# Patient Record
Sex: Male | Born: 1965
Health system: Southern US, Community
[De-identification: ages and names within clinical notes are randomized; demographics above are authoritative.]

## PROBLEM LIST (undated history)

## (undated) DIAGNOSIS — E119 Type 2 diabetes mellitus without complications: Secondary | ICD-10-CM

## (undated) DIAGNOSIS — E785 Hyperlipidemia, unspecified: Secondary | ICD-10-CM

## (undated) HISTORY — DX: Type 2 diabetes mellitus without complications: E11.9

## (undated) HISTORY — PX: ROTATOR CUFF REPAIR: SHX139

---

## 2011-08-16 ENCOUNTER — Emergency Department (INDEPENDENT_AMBULATORY_CARE_PROVIDER_SITE_OTHER)
Admission: EM | Admit: 2011-08-16 | Discharge: 2011-08-16 | Disposition: A | Discharge status: Home / Self Care | Source: Home / Self Care | Attending: Family Medicine | Admitting: Family Medicine

## 2011-08-16 DIAGNOSIS — J0100 Acute maxillary sinusitis, unspecified: Secondary | ICD-10-CM

## 2011-08-16 DIAGNOSIS — J029 Acute pharyngitis, unspecified: Secondary | ICD-10-CM

## 2011-08-16 HISTORY — DX: Hyperlipidemia, unspecified: E78.5

## 2011-08-16 LAB — POCT RAPID STREP A (OFFICE): Rapid Strep A Screen: NEGATIVE

## 2011-08-16 MED ORDER — BENZONATATE 200 MG PO CAPS: 200.0000 mg | ORAL_CAPSULE | Freq: Every day | ORAL | Status: AC

## 2011-08-16 MED ORDER — AZITHROMYCIN 250 MG PO TABS: ORAL_TABLET | ORAL | Status: AC

## 2011-08-16 NOTE — ED Notes (Signed)
Right ear, right eye discomfort and sore throat started last night

## 2011-08-16 NOTE — Discharge Instructions (Signed)
Take Mucinex D (guaifenesin with decongestant) twice daily for congestion.  Increase fluid intake, rest. May use Afrin nasal spray (or generic oxymetazoline) twice daily for about 5 days.  Also recommend using saline nasal spray several times daily and saline nasal irrigation (AYR is a common brand) Stop all antihistamines for now, and other non-prescription cough/cold preparations.     

## 2011-08-16 NOTE — ED Provider Notes (Signed)
History     CSN: [id number]  Arrival date & time 08/16/11  1191   First MD Initiated Contact with Patient 08/16/11 1922      Chief Complaint  Patient presents with  . Cough      HPI Comments: Patient complains of approximately two week history of persistent sinus congestion and watery eyes which he attributed to his seasonal allergies.  About four days ago he developed gradually progressive URI symptoms beginning with a mild sore throat, now worse on the right side.  A cough started next.  Complains of fatigue but no myalgias.  Cough is now worse at night and generally non-productive during the day.  There has been no pleuritic pain, shortness of breath, or wheezes.  He believes that he has had a low grade fever.  The history is provided by the patient.    Past Medical History  Diagnosis Date  . Hyperlipidemia     Past Surgical History  Procedure Date  . Rotator cuff repair     Family History  Problem Relation Age of Onset  . Cancer Mother   . Heart disease Mother   . Cancer Father   . Heart disease Father     History  Substance Use Topics  . Smoking status: Never Smoker   . Smokeless tobacco: Not on file  . Alcohol Use: No      Review of Systems + sore throat on right + cough No pleuritic pain No wheezing + nasal congestion + post-nasal drainage ? sinus pain/pressure No itchy/red eyes No  earache No hemoptysis No SOB ? Low grade fever, ? chills No nausea No vomiting No abdominal pain No diarrhea No urinary symptoms No skin rashes + fatigue No myalgias No headache Used OTC meds without relief (Claritin) Allergies  Review of patient's allergies indicates no known allergies.  Home Medications   Current Outpatient Rx  Name Route Sig Dispense Refill  . ASPIRIN 81 MG PO TABS Oral Take 81 mg by mouth daily.    Marland Kitchen EZETIMIBE-SIMVASTATIN 10-10 MG PO TABS Oral Take 1 tablet by mouth at bedtime.    Marland Kitchen ONE-DAILY MULTI VITAMINS PO TABS Oral Take 1 tablet  by mouth daily.    . OMEGA-3-ACID ETHYL ESTERS 1 G PO CAPS Oral Take 2 g by mouth 2 (two) times daily.    . AZITHROMYCIN 250 MG PO TABS  Take 2 tabs today; then begin one tab once daily for 4 more days. 6 each 0  . BENZONATATE 200 MG PO CAPS Oral Take 1 capsule (200 mg total) by mouth at bedtime. Take as needed for cough 12 capsule 0    BP 125/78  Pulse 70  Temp(Src) 97.9 F (36.6 C) (Oral)  Resp 18  Ht 5\' 6"  (1.676 m)  Wt 173 lb 8 oz (78.699 kg)  BMI 28.00 kg/m2  SpO2 95%  Physical Exam Nursing notes and Vital Signs reviewed. Appearance:  Patient appears healthy, stated age, and in no acute distress Eyes:  Pupils are equal, round, and reactive to light and accomodation.  Extraocular movement is intact.  Conjunctivae are not inflamed  Ears:  Canals normal.  Tympanic membranes normal.  Nose:  Congested turbinates.  No sinus tenderness.   Pharynx:  Erythema at base of uvula Neck:  Supple.   Tender shotty right anterior/posterior nodes   Lungs:  Clear to auscultation.  Breath sounds are equal.  Heart:  Regular rate and rhythm without murmurs, rubs, or gallops.  Abdomen:  Nontender without  masses or hepatosplenomegaly.  Bowel sounds are present.  No CVA or flank tenderness.  Extremities:  No edema.  No calf tenderness Skin:  No rash present.   ED Course  Procedures  none   Labs Reviewed  POCT RAPID STREP A (OFFICE) negative      1. Acute pharyngitis   2. Acute maxillary sinusitis   Suspect an early viral URI    MDM   Begin a Z-pack.  Prescription written for Benzonatate [facility]) to take at bedtime for night-time cough.  Take Mucinex D (guaifenesin with decongestant) twice daily for congestion.  Increase fluid intake, rest. May use Afrin nasal spray (or generic oxymetazoline) twice daily for about 5 days.  Also recommend using saline nasal spray several times daily and saline nasal irrigation (AYR is a common brand) Stop all antihistamines for now, and other  non-prescription cough/cold preparations. Followup with Family Doctor if not improved in one week.         Lattie Haw, MD 08/16/11 [phone]

## 2011-08-21 ENCOUNTER — Emergency Department: Admit: 2011-08-21 | Discharge: 2011-08-21 | Disposition: A | Discharge status: Home / Self Care

## 2011-08-21 ENCOUNTER — Encounter: Payer: Self-pay | Admitting: *Deleted

## 2011-08-21 ENCOUNTER — Emergency Department (INDEPENDENT_AMBULATORY_CARE_PROVIDER_SITE_OTHER)
Admission: EM | Admit: 2011-08-21 | Discharge: 2011-08-21 | Disposition: A | Discharge status: Home / Self Care | Source: Home / Self Care | Attending: Family Medicine | Admitting: Family Medicine

## 2011-08-21 DIAGNOSIS — J209 Acute bronchitis, unspecified: Secondary | ICD-10-CM

## 2011-08-21 MED ORDER — CLARITHROMYCIN 500 MG PO TABS: 500.0000 mg | ORAL_TABLET | Freq: Two times a day (BID) | ORAL | Status: AC

## 2011-08-21 MED ORDER — PREDNISONE 10 MG PO TABS: ORAL_TABLET | ORAL | Status: DC

## 2011-08-21 MED ORDER — GUAIFENESIN-CODEINE 100-10 MG/5ML PO SYRP: 10.0000 mL | ORAL_SOLUTION | Freq: Every day | ORAL | Status: AC

## 2011-08-21 MED ORDER — GUAIFENESIN ER 600 MG PO TB12: 1200.0000 mg | ORAL_TABLET | Freq: Two times a day (BID) | ORAL | Status: AC

## 2011-08-21 NOTE — ED Notes (Signed)
Pt seen here 3 days ago for cough.  Pt still having cough getting worse.  Cough is dry pt states losing sleep, sore neck and chest from coughing.

## 2011-08-21 NOTE — ED Provider Notes (Addendum)
History     CSN: [id number]  Arrival date & time 08/21/11  1158   First MD Initiated Contact with Patient 08/21/11 1254      Chief Complaint  Patient presents with  . Cough      HPI Comments: Patient reports that he finished the Z-pack but complains of persistent non-productive cough, worse at night.  He often coughs until he gags, and has developed discomfort in his right anterior chest when coughing.  He has mild shortness of breath with activity.  No fever but has had sweats.  He states that his sinus congestion has essentially resolved. He states that he believes that his tetanus immunization is current (he is in the [employer]).  Patient is a 46 y.o. male presenting with cough. The history is provided by the patient.  Cough This is a recurrent problem. The current episode started more than 1 week ago. The problem occurs every few minutes. The problem has been gradually worsening. The cough is non-productive. There has been no fever. Associated symptoms include chest pain, sweats and shortness of breath. Pertinent negatives include no chills, no weight loss, no ear congestion, no ear pain, no headaches, no rhinorrhea, no sore throat, no myalgias, no wheezing and no eye redness. Treatments tried: antibiotic. The treatment provided no relief. He is not a smoker.    Past Medical History  Diagnosis Date  . Hyperlipidemia     Past Surgical History  Procedure Date  . Rotator cuff repair     Family History  Problem Relation Age of Onset  . Cancer Mother   . Heart disease Mother   . Cancer Father   . Heart disease Father     History  Substance Use Topics  . Smoking status: Never Smoker   . Smokeless tobacco: Not on file  . Alcohol Use: No      Review of Systems  Constitutional: Negative for chills and weight loss.  HENT: Negative for ear pain, sore throat and rhinorrhea.   Eyes: Negative for redness.  Respiratory: Positive for cough and shortness of breath. Negative for  wheezing.   Cardiovascular: Positive for chest pain.  Musculoskeletal: Negative for myalgias.  Neurological: Negative for headaches.   No sore throat No cough ? pleuritic pain; has discomfort in right anterior chest with cough No wheezing + nasal congestion + post-nasal drainage No sinus pain/pressure No itchy/red eyes No earache No hemoptysis + mild SOB with activity No fever/chills, + sweats No nausea No vomiting No abdominal pain No diarrhea No urinary symptoms No skin rashes + fatigue No myalgias No headache   Allergies  Review of patient's allergies indicates no known allergies.  Home Medications   Current Outpatient Rx  Name Route Sig Dispense Refill  . FENOFIBRATE 145 MG PO TABS Oral Take 145 mg by mouth daily. Specific dosage unknown    . NIACIN 50 MG PO TABS Oral Take 50 mg by mouth daily with breakfast. Dosage unknown    . ASPIRIN 81 MG PO TABS Oral Take 81 mg by mouth daily.    . AZITHROMYCIN 250 MG PO TABS  Take 2 tabs today; then begin one tab once daily for 4 more days. 6 each 0  . BENZONATATE 200 MG PO CAPS Oral Take 1 capsule (200 mg total) by mouth at bedtime. Take as needed for cough 12 capsule 0  . CLARITHROMYCIN 500 MG PO TABS Oral Take 1 tablet (500 mg total) by mouth 2 (two) times daily. 14 tablet 0  .  EZETIMIBE-SIMVASTATIN 10-10 MG PO TABS Oral Take 1 tablet by mouth at bedtime.    . GUAIFENESIN ER 600 MG PO TB12 Oral Take 2 tablets (1,200 mg total) by mouth 2 (two) times daily. Take with 8oz glass of water 28 tablet 0  . GUAIFENESIN-CODEINE 100-10 MG/5ML PO SYRP Oral Take 10 mLs by mouth at bedtime. for cough 120 mL 0  . ONE-DAILY MULTI VITAMINS PO TABS Oral Take 1 tablet by mouth daily.    . OMEGA-3-ACID ETHYL ESTERS 1 G PO CAPS Oral Take 2 g by mouth 2 (two) times daily.    Marland Kitchen PREDNISONE 10 MG PO TABS  Take 2 tabs by mouth today, then two tabs twice daily for two days, then one tab twice daily for 2 days, then 1 tab daily for two days.  Take PC 16  tablet 0    BP 130/84  Pulse 77  Temp(Src) 98.3 F (36.8 C) (Oral)  Resp 16  Ht 5\' 6"  (1.676 m)  Wt 167 lb 8 oz (75.978 kg)  BMI 27.04 kg/m2  SpO2 97%  Physical Exam Nursing notes and Vital Signs reviewed. Appearance:  Patient appears healthy, stated age, and in no acute distress.  He is Alert and oriented  Eyes:  Pupils are equal, round, and reactive to light and accomodation.  Extraocular movement is intact.  Conjunctivae are not inflamed  Ears:  Canals normal.  Tympanic membranes normal.  Nose:  Mildly congested turbinates.  No sinus tenderness.    Pharynx:  Normal Neck:  Supple.  Slightly tender shotty posterior nodes are palpated bilaterally  Lungs:  Clear to auscultation.  Breath sounds are equal.  Chest:  No tenderness to palpation Heart:  Regular rate and rhythm without murmurs, rubs, or gallops.  Abdomen:  Nontender without masses or hepatosplenomegaly.  Bowel sounds are present.  No CVA or flank tenderness.  Extremities:  No edema.  No calf tenderness Skin:  No rash present.   ED Course  Procedures    Dg Chest 2 View  08/21/2011  *RADIOLOGY REPORT*  Clinical Data: Persistent cough for 10 days  CHEST - 2 VIEW  Comparison: None  Findings: The heart size and mediastinal contours are within normal limits.  Both lungs are clear.  The visualized skeletal structures are unremarkable.  IMPRESSION: Negative exam.  Original Report Authenticated By: Rosealee Albee, M.D.     1. Acute bronchitis   Sinusitis appears to be resolved    MDM   ? Pertussis Begin Biaxin.  Robitussin AC at bedtime.  Tapering course of prednisone.  Increase fluid intake, rest.  Will ask patient to hold cholesterol medications for about 5 days to minimize possibility of hepatic injury with Biaxin. Stop all antihistamines for now, and other non-prescription cough/cold preparations. Followup with Family Doctor if not improved in one week.         Lattie Haw, MD 08/21/11 1403  Lattie Haw, MD 08/21/11 [phone]

## 2011-08-21 NOTE — Discharge Instructions (Signed)
Increase fluid intake, rest.  Hold cholesterol medications for about 5 days. Stop all antihistamines for now, and other non-prescription cough/cold preparations.

## 2011-08-23 ENCOUNTER — Telehealth: Payer: Self-pay | Admitting: Family Medicine

## 2011-08-26 ENCOUNTER — Telehealth: Payer: Self-pay | Admitting: Emergency Medicine

## 2011-11-20 ENCOUNTER — Emergency Department (INDEPENDENT_AMBULATORY_CARE_PROVIDER_SITE_OTHER)
Admission: EM | Admit: 2011-11-20 | Discharge: 2011-11-20 | Disposition: A | Discharge status: Home / Self Care | Source: Home / Self Care | Attending: Family Medicine | Admitting: Family Medicine

## 2011-11-20 ENCOUNTER — Telehealth: Payer: Self-pay | Admitting: Family Medicine

## 2011-11-20 DIAGNOSIS — H109 Unspecified conjunctivitis: Secondary | ICD-10-CM

## 2011-11-20 MED ORDER — POLYMYXIN B-TRIMETHOPRIM 10000-0.1 UNIT/ML-% OP SOLN: OPHTHALMIC | Status: DC

## 2011-11-20 MED ORDER — SULFACETAMIDE-PREDNISOLONE 10-0.2 % OP SUSP: 2.0000 [drp] | OPHTHALMIC | Status: DC

## 2011-11-20 MED ORDER — PREDNISOLONE ACETATE 1 % OP SUSP: 1.0000 [drp] | Freq: Four times a day (QID) | OPHTHALMIC | Status: AC

## 2011-11-20 NOTE — ED Notes (Signed)
Caleb Beck woke on Friday morning with a tender right eye. He did do some lawn work the day before. When he woke today his right eye was red and watery. He is unable to keep his right eye open for any length of time.

## 2011-11-20 NOTE — ED Provider Notes (Signed)
History     CSN: [id number]  Arrival date & time 11/20/11  1249   First MD Initiated Contact with Patient 11/20/11 1348      Chief Complaint  Patient presents with  . Eye Drainage    watery right eye     HPI Comments: Patient cut grass two days ago, and the next morning awoke with irritation and redness in his right eye.  He had been wearing his contacts while cutting grass.  Today his left eye has become red and irritated.  He has mild nasal congestion and has a history of mild seasonal rhinitis.  He tried some of his wife's Pataday eye drops without improvement.  No changes in vision.  Patient is a 46 y.o. male presenting with conjunctivitis. The history is provided by the patient.  Conjunctivitis  The current episode started 2 days ago. The onset was sudden. The problem occurs continuously. The problem has been gradually worsening. The problem is moderate. Nothing relieves the symptoms. The symptoms are aggravated by light. Associated symptoms include photophobia, congestion, mouth sores, rhinorrhea, eye discharge and eye redness. Pertinent negatives include no fever, no decreased vision, no double vision, no eye itching, no ear discharge, no ear pain, no headaches, no hearing loss, no sore throat, no swollen glands, no cough, no URI and no eye pain. The eye pain is mild. There is pain in both eyes. The eye pain is associated with movement.    Past Medical History  Diagnosis Date  . Hyperlipidemia     Past Surgical History  Procedure Date  . Rotator cuff repair     Family History  Problem Relation Age of Onset  . Cancer Mother   . Heart disease Mother   . Cancer Father   . Heart disease Father     History  Substance Use Topics  . Smoking status: Never Smoker   . Smokeless tobacco: Not on file  . Alcohol Use: No      Review of Systems  Constitutional: Negative for fever.  HENT: Positive for congestion, rhinorrhea and mouth sores. Negative for hearing loss, ear pain,  sore throat and ear discharge.   Eyes: Positive for photophobia, discharge and redness. Negative for double vision, pain and itching.  Respiratory: Negative for cough.   Neurological: Negative for headaches.  All other systems reviewed and are negative.    Allergies  Review of patient's allergies indicates no known allergies.  Home Medications   Current Outpatient Rx  Name Route Sig Dispense Refill  . ASPIRIN 81 MG PO TABS Oral Take 81 mg by mouth daily.    Marland Kitchen EZETIMIBE-SIMVASTATIN 10-10 MG PO TABS Oral Take 1 tablet by mouth at bedtime.    . FENOFIBRATE 145 MG PO TABS Oral Take 145 mg by mouth daily. Specific dosage unknown    . GUAIFENESIN ER 600 MG PO TB12 Oral Take 2 tablets (1,200 mg total) by mouth 2 (two) times daily. Take with 8oz glass of water 28 tablet 0  . ONE-DAILY MULTI VITAMINS PO TABS Oral Take 1 tablet by mouth daily.    Marland Kitchen NIACIN 50 MG PO TABS Oral Take 50 mg by mouth daily with breakfast. Dosage unknown    . OMEGA-3-ACID ETHYL ESTERS 1 G PO CAPS Oral Take 2 g by mouth 2 (two) times daily.    Marland Kitchen PREDNISONE 10 MG PO TABS  Take 2 tabs by mouth today, then two tabs twice daily for two days, then one tab twice daily for 2 days,  then 1 tab daily for two days.  Take PC 16 tablet 0  . SULFACETAMIDE-PREDNISOLONE 10-0.2 % OP SUSP Both Eyes Place 2 drops into both eyes every 4 (four) hours while awake. Use for 4 days then stop. 10 mL 0    BP 126/85  Pulse 70  Temp 98 F (36.7 C) (Oral)  Resp 17  Ht 5\' 6"  (1.676 m)  Wt 165 lb (74.844 kg)  BMI 26.63 kg/m2  SpO2 97%  Physical Exam  Constitutional: He appears well-developed and well-nourished. No distress.  HENT:  Head: Atraumatic.  Right Ear: Tympanic membrane normal.  Left Ear: Tympanic membrane normal.  Nose: Nose normal.  Mouth/Throat: Oropharynx is clear and moist. No oropharyngeal exudate.  Eyes: EOM and lids are normal. Pupils are equal, round, and reactive to light. No foreign bodies found. Right eye exhibits no  chemosis, no discharge, no exudate and no hordeolum. No foreign body present in the right eye. Left eye exhibits no chemosis, no discharge, no exudate and no hordeolum. No foreign body present in the left eye. Right conjunctiva is injected. Right conjunctiva has no hemorrhage. Left conjunctiva is injected. Left conjunctiva has no hemorrhage. No scleral icterus.       Right eye reveals no fluorescein uptake.   Neck: Neck supple.  Cardiovascular: Normal rate and normal heart sounds.   Pulmonary/Chest: Breath sounds normal.  Lymphadenopathy:    He has no cervical adenopathy.    ED Course  Procedures  none      1. Conjunctivitis of both eyes       MDM  Begin Polytrim ophth susp, 1 gtt QID, and prednisolone ophth susp, 1gtt QID Followup with ophthalmologist if not improved 4 days.        Lattie Haw, MD 11/20/11 [phone]

## 2011-11-22 ENCOUNTER — Telehealth: Payer: Self-pay

## 2011-11-22 NOTE — ED Notes (Signed)
I called and spoke with patient and he is doing better. I advised to call back if anything changes or if he has questions or concerns.  

## 2012-05-29 ENCOUNTER — Encounter: Payer: Self-pay | Admitting: Emergency Medicine

## 2012-05-29 ENCOUNTER — Emergency Department (INDEPENDENT_AMBULATORY_CARE_PROVIDER_SITE_OTHER)
Admission: EM | Admit: 2012-05-29 | Discharge: 2012-05-29 | Disposition: A | Discharge status: Home / Self Care | Source: Home / Self Care | Attending: Family Medicine | Admitting: Family Medicine

## 2012-05-29 DIAGNOSIS — J209 Acute bronchitis, unspecified: Secondary | ICD-10-CM

## 2012-05-29 MED ORDER — BENZONATATE 200 MG PO CAPS: 200.0000 mg | ORAL_CAPSULE | Freq: Every day | ORAL | Status: DC

## 2012-05-29 MED ORDER — CLARITHROMYCIN 500 MG PO TABS: 500.0000 mg | ORAL_TABLET | Freq: Two times a day (BID) | ORAL | Status: DC

## 2012-05-29 NOTE — ED Provider Notes (Signed)
History     CSN: [id number]  Arrival date & time 05/29/12  1625   First MD Initiated Contact with Patient 05/29/12 1655      Chief Complaint  Patient presents with  . Cough      HPI Comments: One week ago patient developed "tickle" in his throat, followed by nasal congestion, mild myalgias, and a non-productive cough.  The cough has persisted and is worse at night.  No fevers, chills, and sweats.  He has had flu immunization this season.  The history is provided by the patient.    Past Medical History  Diagnosis Date  . Hyperlipidemia     Past Surgical History  Procedure Date  . Rotator cuff repair     Family History  Problem Relation Age of Onset  . Cancer Mother   . Heart disease Mother   . Cancer Father   . Heart disease Father     History  Substance Use Topics  . Smoking status: Never Smoker   . Smokeless tobacco: Not on file  . Alcohol Use: No      Review of Systems No sore throat + cough No pleuritic pain No wheezing + minimal nasal congestion ? post-nasal drainage No sinus pain/pressure No itchy/red eyes No earache No hemoptysis No SOB No fever/chills No nausea No vomiting No abdominal pain No diarrhea No urinary symptoms No skin rashes + fatigue + mild myalgias No headache Used OTC meds without relief  Allergies  Review of patient's allergies indicates no known allergies.  Home Medications   Current Outpatient Rx  Name  Route  Sig  Dispense  Refill  . ASPIRIN 81 MG PO TABS   Oral   Take 81 mg by mouth daily.         Marland Kitchen BENZONATATE 200 MG PO CAPS   Oral   Take 1 capsule (200 mg total) by mouth at bedtime. Take as needed for cough   12 capsule   0   . CLARITHROMYCIN 500 MG PO TABS   Oral   Take 1 tablet (500 mg total) by mouth 2 (two) times daily.   14 tablet   0   . EZETIMIBE-SIMVASTATIN 10-10 MG PO TABS   Oral   Take 1 tablet by mouth at bedtime.         . FENOFIBRATE 145 MG PO TABS   Oral   Take 145 mg by  mouth daily. Specific dosage unknown         . GUAIFENESIN ER 600 MG PO TB12   Oral   Take 2 tablets (1,200 mg total) by mouth 2 (two) times daily. Take with 8oz glass of water   28 tablet   0   . ONE-DAILY MULTI VITAMINS PO TABS   Oral   Take 1 tablet by mouth daily.         Marland Kitchen NIACIN 50 MG PO TABS   Oral   Take 50 mg by mouth daily with breakfast. Dosage unknown         . OMEGA-3-ACID ETHYL ESTERS 1 G PO CAPS   Oral   Take 2 g by mouth 2 (two) times daily.         Marland Kitchen PREDNISONE 10 MG PO TABS      Take 2 tabs by mouth today, then two tabs twice daily for two days, then one tab twice daily for 2 days, then 1 tab daily for two days.  Take PC   16 tablet  0   . POLYMYXIN B-TRIMETHOPRIM 10000-0.1 UNIT/ML-% OP SOLN      Place one drop in both eyes four times daily   10 mL   0     BP 128/89  Pulse 83  Temp 97.7 F (36.5 C) (Oral)  Resp 16  Ht 5\' 6"  (1.676 m)  Wt 178 lb (80.74 kg)  BMI 28.73 kg/m2  SpO2 93%  Physical Exam Nursing notes and Vital Signs reviewed. Appearance:  Patient appears healthy, stated age, and in no acute distress Eyes:  Pupils are equal, round, and reactive to light and accomodation.  Extraocular movement is intact.  Conjunctivae are not inflamed  Ears:  Canals normal.  Tympanic membranes normal.  Nose:  Mildly congested turbinates.  No sinus tenderness.   Pharynx:  Normal Neck:  Supple.   No adenopathy Lungs:  Clear to auscultation.  Breath sounds are equal.  Heart:  Regular rate and rhythm without murmurs, rubs, or gallops.  Abdomen:  Nontender without masses or hepatosplenomegaly.  Bowel sounds are present.  No CVA or flank tenderness.  Extremities:  No edema.  No calf tenderness Skin:  No rash present.   ED Course  Procedures none      1. Acute bronchitis       MDM  Will begin Biaxin for atypical coverage.  Prescription written for Benzonatate [facility]) to take at bedtime for night-time cough.  Take plain Mucinex  (guaifenesin) twice daily for cough and congestion.  Increase fluid intake, rest. May use Afrin nasal spray (or generic oxymetazoline) twice daily for about 5 days.  Also recommend using saline nasal spray several times daily and saline nasal irrigation (AYR is a common brand) Stop all antihistamines for now, and other non-prescription cough/cold preparations. Stop taking Vytorin while taking Biaxin. Follow-up with family doctor if not improving 7 to 10 days.         Lattie Haw, MD 05/29/12 1755

## 2012-05-29 NOTE — ED Notes (Signed)
Dry cough x 1 week 

## 2012-06-15 ENCOUNTER — Encounter: Payer: Self-pay | Admitting: Emergency Medicine

## 2012-06-15 ENCOUNTER — Emergency Department (INDEPENDENT_AMBULATORY_CARE_PROVIDER_SITE_OTHER)
Admission: EM | Admit: 2012-06-15 | Discharge: 2012-06-15 | Disposition: A | Discharge status: Home / Self Care | Source: Home / Self Care | Attending: Family Medicine | Admitting: Family Medicine

## 2012-06-15 DIAGNOSIS — R05 Cough: Secondary | ICD-10-CM

## 2012-06-15 DIAGNOSIS — J309 Allergic rhinitis, unspecified: Secondary | ICD-10-CM

## 2012-06-15 MED ORDER — CETIRIZINE HCL 10 MG PO CAPS: 10.0000 mg | ORAL_CAPSULE | Freq: Every day | ORAL | Status: DC

## 2012-06-15 MED ORDER — DOXYCYCLINE HYCLATE 100 MG PO CAPS: 100.0000 mg | ORAL_CAPSULE | Freq: Two times a day (BID) | ORAL | Status: AC

## 2012-06-15 MED ORDER — FLUTICASONE PROPIONATE 50 MCG/ACT NA SUSP: 2.0000 | Freq: Every day | NASAL | Status: DC

## 2012-06-15 MED ORDER — METHYLPREDNISOLONE ACETATE 80 MG/ML IJ SUSP
80.0000 mg | Freq: Once | INTRAMUSCULAR | Status: AC
  Administered 2012-06-15: 80 mg via INTRAMUSCULAR

## 2012-06-15 MED ORDER — HYDROCOD POLST-CHLORPHEN POLST 10-8 MG/5ML PO LQCR: 5.0000 mL | Freq: Two times a day (BID) | ORAL | Status: DC | PRN

## 2012-06-15 NOTE — ED Provider Notes (Signed)
History     CSN: [id number]  Arrival date & time 06/15/12  1634   First MD Initiated Contact with Patient 06/15/12 1635      Chief Complaint  Patient presents with  . Cough   HPI Patient presents today with chief complaint of cough. Patient was recently seen back on January 13 with similar symptoms in the setting of questionable your eye. Patient was placed on course of Biaxin as well as Lawyer for symptomatic management. Patient states the majority symptoms have improved significantly since his last visit. However, patient states that nasal drainage as well as cough is been quite persistent. Patient states the cough is fairly dry in nature. No shortness of breath. Cough seems to be most predominant at night. No fevers. Patient does report some mild allergic symptoms including rhinorrhea sneezing and developing over the past 3-4 years. Patient has had some mild sneezing with symptoms. Patient denies any wheezing associated with symptoms. Does have a minimal amount of nasal congestion. Patient is a nonsmoker.   Past Medical History  Diagnosis Date  . Hyperlipidemia     Past Surgical History  Procedure Date  . Rotator cuff repair     Family History  Problem Relation Age of Onset  . Cancer Mother   . Heart disease Mother   . Cancer Father   . Heart disease Father     History  Substance Use Topics  . Smoking status: Never Smoker   . Smokeless tobacco: Not on file  . Alcohol Use: No      Review of Systems  All other systems reviewed and are negative.    Allergies  Review of patient's allergies indicates not on file.  Home Medications   Current Outpatient Rx  Name  Route  Sig  Dispense  Refill  . ASPIRIN 81 MG PO TABS   Oral   Take 81 mg by mouth daily.         Marland Kitchen BENZONATATE 200 MG PO CAPS   Oral   Take 1 capsule (200 mg total) by mouth at bedtime. Take as needed for cough   12 capsule   0   . CLARITHROMYCIN 500 MG PO TABS   Oral   Take 1  tablet (500 mg total) by mouth 2 (two) times daily.   14 tablet   0   . EZETIMIBE-SIMVASTATIN 10-10 MG PO TABS   Oral   Take 1 tablet by mouth at bedtime.         . FENOFIBRATE 145 MG PO TABS   Oral   Take 145 mg by mouth daily. Specific dosage unknown         . GUAIFENESIN ER 600 MG PO TB12   Oral   Take 2 tablets (1,200 mg total) by mouth 2 (two) times daily. Take with 8oz glass of water   28 tablet   0   . ONE-DAILY MULTI VITAMINS PO TABS   Oral   Take 1 tablet by mouth daily.         Marland Kitchen NIACIN 50 MG PO TABS   Oral   Take 50 mg by mouth daily with breakfast. Dosage unknown         . OMEGA-3-ACID ETHYL ESTERS 1 G PO CAPS   Oral   Take 2 g by mouth 2 (two) times daily.         Marland Kitchen PREDNISONE 10 MG PO TABS      Take 2 tabs by mouth today, then two  tabs twice daily for two days, then one tab twice daily for 2 days, then 1 tab daily for two days.  Take PC   16 tablet   0   . POLYMYXIN B-TRIMETHOPRIM 10000-0.1 UNIT/ML-% OP SOLN      Place one drop in both eyes four times daily   10 mL   0     BP 111/73  Pulse 93  Temp 98.7 F (37.1 C) (Oral)  Resp 16  Ht 5\' 6"  (1.676 m)  Wt 173 lb (78.472 kg)  BMI 27.92 kg/m2  SpO2 96%  Physical Exam  Constitutional: He appears well-developed and well-nourished.  HENT:  Head: Normocephalic and atraumatic.  Right Ear: External ear normal.  Left Ear: External ear normal.       +nasal erythema, rhinorrhea bilaterally, + post oropharyngeal erythema    Eyes: Conjunctivae normal are normal. Pupils are equal, round, and reactive to light.  Neck: Normal range of motion. Neck supple.  Cardiovascular: Normal rate, regular rhythm and normal heart sounds.   Pulmonary/Chest: Effort normal and breath sounds normal.  Abdominal: Soft.  Musculoskeletal: Normal range of motion.  Lymphadenopathy:    He has no cervical adenopathy.  Neurological: He is alert.  Skin: Skin is warm.    ED Course  Procedures (including critical  care time)  Labs Reviewed - No data to display No results found.   1. Cough   2. Allergic rhinitis       MDM  I suspect his symptoms are predominantly upper respiratory and origin with underlying allergic rhinitis is currently not being treated as well as post viral cough. Will place patient on course of Flonase and Zyrtec to help with this. Patient given shot of Depo-Medrol 80 mg IM x1 to help with sinus inflammation. Will place on course of doxycycline for lower respiratory coverage. Discuss infectious and respiratory red flags at length with patient. Plan to follow PCP if not improved within next 5-7 days.    The patient and/or caregiver has been counseled thoroughly with regard to treatment plan and/or medications prescribed including dosage, schedule, interactions, rationale for use, and possible side effects and they verbalize understanding. Diagnoses and expected course of recovery discussed and will return if not improved as expected or if the condition worsens. Patient and/or caregiver verbalized understanding.               Doree Albee, MD 06/15/12 [phone]

## 2012-06-15 NOTE — ED Notes (Signed)
Dry cough x 3 weeks 

## 2012-11-02 ENCOUNTER — Other Ambulatory Visit: Payer: Self-pay | Admitting: Family Medicine

## 2013-07-24 ENCOUNTER — Emergency Department (INDEPENDENT_AMBULATORY_CARE_PROVIDER_SITE_OTHER)
Admission: EM | Admit: 2013-07-24 | Discharge: 2013-07-24 | Disposition: A | Discharge status: Home / Self Care | Source: Home / Self Care | Attending: Emergency Medicine | Admitting: Emergency Medicine

## 2013-07-24 ENCOUNTER — Encounter: Payer: Self-pay | Admitting: Emergency Medicine

## 2013-07-24 DIAGNOSIS — S161XXA Strain of muscle, fascia and tendon at neck level, initial encounter: Secondary | ICD-10-CM

## 2013-07-24 DIAGNOSIS — S139XXA Sprain of joints and ligaments of unspecified parts of neck, initial encounter: Secondary | ICD-10-CM

## 2013-07-24 MED ORDER — IBUPROFEN 200 MG PO TABS: ORAL_TABLET | ORAL | Status: DC

## 2013-07-24 MED ORDER — CYCLOBENZAPRINE HCL 5 MG PO TABS: ORAL_TABLET | ORAL | Status: DC
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
## 2013-10-15 ENCOUNTER — Other Ambulatory Visit (HOSPITAL_BASED_OUTPATIENT_CLINIC_OR_DEPARTMENT_OTHER): Payer: Self-pay | Admitting: Sports Medicine

## 2013-10-15 DIAGNOSIS — M545 Low back pain, unspecified: Secondary | ICD-10-CM

## 2013-10-16 ENCOUNTER — Ambulatory Visit (HOSPITAL_BASED_OUTPATIENT_CLINIC_OR_DEPARTMENT_OTHER)
Admission: RE | Admit: 2013-10-16 | Discharge: 2013-10-16 | Disposition: A | Discharge status: Home / Self Care | Source: Ambulatory Visit | Attending: Sports Medicine | Admitting: Sports Medicine

## 2013-10-16 DIAGNOSIS — M545 Low back pain, unspecified: Secondary | ICD-10-CM | POA: Insufficient documentation

## 2013-10-16 DIAGNOSIS — M5126 Other intervertebral disc displacement, lumbar region: Secondary | ICD-10-CM | POA: Insufficient documentation

## 2015-04-30 ENCOUNTER — Emergency Department (INDEPENDENT_AMBULATORY_CARE_PROVIDER_SITE_OTHER)

## 2015-04-30 ENCOUNTER — Emergency Department (INDEPENDENT_AMBULATORY_CARE_PROVIDER_SITE_OTHER)
Admission: EM | Admit: 2015-04-30 | Discharge: 2015-04-30 | Disposition: A | Discharge status: Home / Self Care | Source: Home / Self Care

## 2015-04-30 ENCOUNTER — Encounter: Payer: Self-pay | Admitting: *Deleted

## 2015-04-30 DIAGNOSIS — R05 Cough: Secondary | ICD-10-CM

## 2015-04-30 DIAGNOSIS — R053 Chronic cough: Secondary | ICD-10-CM

## 2015-04-30 MED ORDER — PREDNISONE 50 MG PO TABS: ORAL_TABLET | ORAL | Status: DC

## 2015-04-30 MED ORDER — GUAIFENESIN-CODEINE 100-10 MG/5ML PO SOLN: ORAL | Status: DC

## 2015-04-30 MED ORDER — AZITHROMYCIN 250 MG PO TABS: ORAL_TABLET | ORAL | Status: DC

## 2015-04-30 NOTE — Discharge Instructions (Signed)
Take plain guaifenesin (1200mg extended release tabs such as Mucinex) twice daily, with plenty of water, for cough and congestion.  Get adequate rest.   ° Also recommend using saline nasal spray several times daily and saline nasal irrigation (AYR is a common brand).   °Stop all antihistamines for now, and other non-prescription cough/cold preparations. °  °  °

## 2015-04-30 NOTE — ED Provider Notes (Signed)
CSN: [id number]     Arrival date & time 04/30/15  1800 History   None    Chief Complaint  Patient presents with  . Cough      HPI Comments: Patient states that he developed a non-productive cough about 6 weeks ago.  He visited his PCP who prescribed a prednisolone taper and Hycodan cough syrup.  The cough improved somewhat but did not resolve.  One month ago he again visited a practitioner at his PCP's office and was prescribed amoxicillin and guaifenesin.  His non-productive cough has persisted and is worse at night.  He does not feel ill.  He denies fevers, chills, and sweats, although he felt warm at work today.  He denies pleuritic pain and shortness of breath.  No GI symptoms.  He has a history of seasonal rhinitis, but no asthma.   The history is provided by the patient.    Past Medical History  Diagnosis Date  . Hyperlipidemia    Past Surgical History  Procedure Laterality Date  . Rotator cuff repair     Family History  Problem Relation Age of Onset  . Cancer Mother   . Heart disease Mother   . Cancer Father   . Heart disease Father    Social History  Substance Use Topics  . Smoking status: Never Smoker   . Smokeless tobacco: None  . Alcohol Use: No    Review of Systems No sore throat + cough No pleuritic pain No wheezing No nasal congestion No post-nasal drainage No sinus pain/pressure No itchy/red eyes No earache No hemoptysis No SOB No fever/chills No nausea No vomiting No abdominal pain No diarrhea No urinary symptoms No skin rash No fatigue No myalgias No headache Used OTC meds without relief  Allergies  Review of patient's allergies indicates no known allergies.  Home Medications   Prior to Admission medications   Medication Sig Start Date End Date Taking? Authorizing Provider  aspirin 81 MG tablet Take 81 mg by mouth daily.   Yes Historical Provider, MD  ezetimibe (ZETIA) 10 MG tablet Take 10 mg by mouth daily.   Yes Historical  Provider, MD  azithromycin (ZITHROMAX Z-PAK) 250 MG tablet Take 2 tabs today; then begin one tab once daily for 4 more days. 04/30/15   Kandra Nicolas, MD  guaiFENesin-codeine 100-10 MG/5ML syrup Take 25mL by mouth at bedtime as needed for cough 04/30/15   Kandra Nicolas, MD  Multiple Vitamin (MULTIVITAMIN) tablet Take 1 tablet by mouth daily.    Historical Provider, MD  omega-3 acid ethyl esters (LOVAZA) 1 G capsule Take 2 g by mouth 2 (two) times daily.    Historical Provider, MD  predniSONE (DELTASONE) 50 MG tablet Take one tab by mouth with food once daily for five days 04/30/15   Kandra Nicolas, MD   Meds Ordered and Administered this Visit  Medications - No data to display  BP 133/81 mmHg  Pulse 75  Temp(Src) 98.2 F (36.8 C) (Oral)  Resp 16  Ht 5\' 6"  (1.676 m)  Wt 172 lb (78.019 kg)  BMI 27.77 kg/m2  SpO2 98% No data found.   Physical Exam Nursing notes and Vital Signs reviewed. Appearance:  Patient appears stated age, and in no acute distress Eyes:  Pupils are equal, round, and reactive to light and accomodation.  Extraocular movement is intact.  Conjunctivae are not inflamed  Ears:  Canals normal.  Tympanic membranes normal.  Nose:  Mildly congested turbinates.  No sinus  tenderness.   Pharynx:  Normal Neck:  Supple.  No adenopathy Lungs:  Clear to auscultation.  Breath sounds are equal.  Moving air well. Heart:  Regular rate and rhythm without murmurs, rubs, or gallops.  Abdomen:  Nontender without masses or hepatosplenomegaly.  Bowel sounds are present.  No CVA or flank tenderness.  Extremities:  No edema.  No calf tenderness Skin:  No rash present.   ED Course  Proceduresnone   Imaging Review Dg Chest 2 View  04/30/2015  CLINICAL DATA:  Cough for 6 weeks EXAM: CHEST  2 VIEW COMPARISON:  August 21, 2011. FINDINGS: There is no edema or consolidation. The heart size and pulmonary vascularity are normal. No adenopathy. No bone lesions. IMPRESSION: No edema or  consolidation. Electronically Signed   By: Lowella Grip III M.D.   On: 04/30/2015 19:03     MDM   1. Persistent cough for 3 weeks or longer    Begin Z-pak for atypical coverage.  Begin prednisone burst.  Robitussin AC at bedtime. Take plain guaifenesin (1200mg  extended release tabs such as Mucinex) twice daily, with plenty of water, for cough and congestion.  Get adequate rest.    Also recommend using saline nasal spray several times daily and saline nasal irrigation (AYR is a common brand).  Stop all antihistamines for now, and other non-prescription cough/cold preparations. Followup with pulmonologist if not improved two weeks.    Kandra Nicolas, MD 04/30/15 [phone]

## 2015-04-30 NOTE — ED Notes (Signed)
Pt c/o cough x 3 mths. He reports that it is productive at times with clear mucus. Denies fever.

## 2016-04-13 ENCOUNTER — Emergency Department (INDEPENDENT_AMBULATORY_CARE_PROVIDER_SITE_OTHER)
Admission: EM | Admit: 2016-04-13 | Discharge: 2016-04-13 | Disposition: A | Discharge status: Home / Self Care | Source: Home / Self Care | Attending: Family Medicine | Admitting: Family Medicine

## 2016-04-13 ENCOUNTER — Encounter: Payer: Self-pay | Admitting: *Deleted

## 2016-04-13 DIAGNOSIS — M62838 Other muscle spasm: Secondary | ICD-10-CM

## 2016-04-13 DIAGNOSIS — M5412 Radiculopathy, cervical region: Secondary | ICD-10-CM

## 2016-04-13 DIAGNOSIS — M79601 Pain in right arm: Secondary | ICD-10-CM

## 2016-04-13 MED ORDER — METHYLPREDNISOLONE SODIUM SUCC 40 MG IJ SOLR
80.0000 mg | Freq: Once | INTRAMUSCULAR | Status: AC
  Administered 2016-04-13: 80 mg via INTRAMUSCULAR

## 2016-04-13 MED ORDER — CYCLOBENZAPRINE HCL 10 MG PO TABS: 10.0000 mg | ORAL_TABLET | Freq: Two times a day (BID) | ORAL | 0 refills | Status: DC | PRN

## 2016-04-13 MED ORDER — PREDNISONE 20 MG PO TABS: ORAL_TABLET | ORAL | 0 refills | Status: DC

## 2016-04-13 MED ORDER — HYDROCODONE-ACETAMINOPHEN 5-325 MG PO TABS: 1.0000 | ORAL_TABLET | Freq: Four times a day (QID) | ORAL | 0 refills | Status: DC | PRN

## 2016-04-13 NOTE — Discharge Instructions (Signed)
°  You were given a shot of solumedrol (a steroid) today to help with muscle pain and swelling.  You have been prescribed prednisone, an oral steroid.  You may start this medication tomorrow with breakfast.    Norco/Vicodin (hydrocodone-acetaminophen) is a narcotic pain medication, do not combine these medications with others containing tylenol. While taking, do not drink alcohol, drive, or perform any other activities that requires focus while taking these medications.   Flexeril is a muscle relaxer and may cause drowsiness. Do not drink alcohol, drive, or operate heavy machinery while taking.

## 2016-04-13 NOTE — ED Provider Notes (Signed)
CSN: [id number]     Arrival date & time 04/13/16  1641 History   First MD Initiated Contact with Patient 04/13/16 1658     Chief Complaint  Patient presents with  . Shoulder Pain   (Consider location/radiation/quality/duration/timing/severity/associated sxs/prior Treatment) HPI Caleb Beck is a 50 y.o. male presenting to UC with c/o 4 days of Right shoulder pain that radiates down his Right arm, associated intermittent numbness/tingling in his Right 1st-3rd fingers.  He did f/u with his chiropractor, numbness resolved, but today he f/u with his chiropractor and had "shock" treatments around 4PM but has since had worsening intermittent sharp shooting pain when he rests his arm for a few seconds or minutes.  If he keeps his arm moving, it tends to feel better.  Pain is 10/10 at times but has been a constant 3-4/10 over the last few days.  Denies known injury including fall but does report brining out Christmas decor and wonders if that is what triggered the pain, or if he slept wrong.  No prior severe pain to same arm/shoulder. Denies neck pain or surgeries.    Past Medical History:  Diagnosis Date  . Hyperlipidemia    Past Surgical History:  Procedure Laterality Date  . ROTATOR CUFF REPAIR     Family History  Problem Relation Age of Onset  . Cancer Mother   . Heart disease Mother   . Cancer Father   . Heart disease Father    Social History  Substance Use Topics  . Smoking status: Never Smoker  . Smokeless tobacco: Never Used  . Alcohol use No    Review of Systems  Musculoskeletal: Positive for arthralgias, back pain (Right upper) and myalgias. Negative for gait problem, joint swelling, neck pain and neck stiffness.  Skin: Negative for color change and rash.  Neurological: Positive for numbness. Negative for weakness.    Allergies  Patient has no known allergies.  Home Medications   Prior to Admission medications   Medication Sig Start Date End Date Taking? Authorizing  Provider  aspirin 81 MG tablet Take 81 mg by mouth daily.    Historical Provider, MD  cyclobenzaprine (FLEXERIL) 10 MG tablet Take 1 tablet (10 mg total) by mouth 2 (two) times daily as needed. 04/13/16   Noland Fordyce, PA-C  ezetimibe (ZETIA) 10 MG tablet Take 10 mg by mouth daily.    Historical Provider, MD  HYDROcodone-acetaminophen (NORCO/VICODIN) 5-325 MG tablet Take 1-2 tablets by mouth every 6 (six) hours as needed for severe pain. 04/13/16   Noland Fordyce, PA-C  Multiple Vitamin (MULTIVITAMIN) tablet Take 1 tablet by mouth daily.    Historical Provider, MD  omega-3 acid ethyl esters (LOVAZA) 1 G capsule Take 2 g by mouth 2 (two) times daily.    Historical Provider, MD  predniSONE (DELTASONE) 20 MG tablet 3 tabs po day one, then 2 po daily x 4 days 04/13/16   Noland Fordyce, PA-C   Meds Ordered and Administered this Visit   Medications  methylPREDNISolone sodium succinate (SOLU-MEDROL) 40 mg/mL injection 80 mg (80 mg Intramuscular Given 04/13/16 1721)    BP 150/95 (BP Location: Left Arm)   Pulse 70   Temp 98.1 F (36.7 C) (Oral)   Resp 16   SpO2 96%  No data found.   Physical Exam  Constitutional: He is oriented to person, place, and time. He appears well-developed and well-nourished. No distress.  HENT:  Head: Normocephalic and atraumatic.  Eyes: EOM are normal.  Neck: Normal range of motion.  Neck supple.  No midline bone tenderness, no crepitus or step-offs.   Cardiovascular: Normal rate.   Pulses:      Radial pulses are 2+ on the right side.  Pulmonary/Chest: Effort normal. No respiratory distress.  Musculoskeletal: Normal range of motion. He exhibits tenderness. He exhibits no edema.  Right shoulder: full ROM. No deformity. No midline spinal tenderness. Tenderness to Right upper trapezius muscle. 5/5 strength in Right arm and grip strength.   Neurological: He is alert and oriented to person, place, and time.  Skin: Skin is warm and dry. Capillary refill takes less than  2 seconds. He is not diaphoretic.  Psychiatric: He has a normal mood and affect. His behavior is normal.  Nursing note and vitals reviewed.   Urgent Care Course   Clinical Course     Procedures (including critical care time)  Labs Review Labs Reviewed - No data to display  Imaging Review No results found.   MDM   1. Trapezius muscle spasm   2. Right arm pain   3. Radiculopathy of cervical region    Pt c/o Right shoulder and arm pain with intermittent numbness for about 4 days. Tenderness to Right upper trapezius. No red flag symptoms. No specific known injury. No indication for imaging at this time. Will treat as muscle spasm.  Solumedrol 80mg  IM in UC Rx: Prednisone, flexeril, and norco May take ibuprofen at home as well. F/u with PCP later this week if not improving.     Noland Fordyce, PA-C 04/13/16 1756

## 2016-04-13 NOTE — ED Triage Notes (Signed)
Pt c/o RT neck/ shoulder pain x 4 days. He went to the chiropractor at 1600 for "shock" treatments with some relief, but now the pain is worse. Denies injury.

## 2016-06-22 ENCOUNTER — Emergency Department (INDEPENDENT_AMBULATORY_CARE_PROVIDER_SITE_OTHER)
Admission: EM | Admit: 2016-06-22 | Discharge: 2016-06-22 | Disposition: A | Discharge status: Home / Self Care | Source: Home / Self Care | Attending: Family Medicine | Admitting: Family Medicine

## 2016-06-22 ENCOUNTER — Encounter: Payer: Self-pay | Admitting: *Deleted

## 2016-06-22 DIAGNOSIS — J069 Acute upper respiratory infection, unspecified: Secondary | ICD-10-CM | POA: Diagnosis not present

## 2016-06-22 DIAGNOSIS — B9789 Other viral agents as the cause of diseases classified elsewhere: Secondary | ICD-10-CM | POA: Diagnosis not present

## 2016-06-22 MED ORDER — IPRATROPIUM BROMIDE 0.06 % NA SOLN: 2.0000 | Freq: Four times a day (QID) | NASAL | 0 refills | Status: AC

## 2016-06-22 MED ORDER — BENZONATATE 100 MG PO CAPS: 100.0000 mg | ORAL_CAPSULE | Freq: Three times a day (TID) | ORAL | 0 refills | Status: DC

## 2016-06-22 NOTE — ED Provider Notes (Signed)
CSN: [id number]     Arrival date & time 06/22/16  1731 History   First MD Initiated Contact with Patient 06/22/16 1752     Chief Complaint  Patient presents with  . Cough   (Consider location/radiation/quality/duration/timing/severity/associated sxs/prior Treatment) HPI Caleb Beck is a 51 y.o. male presenting to UC with c/o 3 days of mild to moderately intermittent productive cough. Mild scratchy throat. Denies fever, chills, n/v/d. Denies ear pain, headache or body aches. Others at work have been sick. No hx of asthma. Denies chest pain or SOB.   Past Medical History:  Diagnosis Date  . Hyperlipidemia    Past Surgical History:  Procedure Laterality Date  . ROTATOR CUFF REPAIR     Family History  Problem Relation Age of Onset  . Cancer Mother   . Heart disease Mother   . Cancer Father   . Heart disease Father    Social History  Substance Use Topics  . Smoking status: Never Smoker  . Smokeless tobacco: Never Used  . Alcohol use No    Review of Systems  Constitutional: Negative for chills and fever.  HENT: Positive for congestion and sore throat. Negative for ear pain, trouble swallowing and voice change.   Respiratory: Positive for cough. Negative for shortness of breath.   Cardiovascular: Negative for chest pain and palpitations.  Gastrointestinal: Negative for abdominal pain, diarrhea, nausea and vomiting.  Musculoskeletal: Negative for arthralgias, back pain and myalgias.  Skin: Negative for rash.  Neurological: Negative for dizziness, light-headedness and headaches.    Allergies  Patient has no known allergies.  Home Medications   Prior to Admission medications   Medication Sig Start Date End Date Taking? Authorizing Provider  aspirin 81 MG tablet Take 81 mg by mouth daily.    Historical Provider, MD  benzonatate (TESSALON) 100 MG capsule Take 1-2 capsules (100-200 mg total) by mouth every 8 (eight) hours. 06/22/16   Noland Fordyce, PA-C  ezetimibe (ZETIA) 10  MG tablet Take 10 mg by mouth daily.    Historical Provider, MD  ipratropium (ATROVENT) 0.06 % nasal spray Place 2 sprays into both nostrils 4 (four) times daily. For 1 week 06/22/16   Noland Fordyce, PA-C  Multiple Vitamin (MULTIVITAMIN) tablet Take 1 tablet by mouth daily.    Historical Provider, MD  omega-3 acid ethyl esters (LOVAZA) 1 G capsule Take 2 g by mouth 2 (two) times daily.    Historical Provider, MD   Meds Ordered and Administered this Visit  Medications - No data to display  BP 118/81 (BP Location: Left Arm)   Pulse 90   Temp 97.7 F (36.5 C) (Oral)   Resp 16   Ht 5\' 6"  (1.676 m)   Wt 176 lb (79.8 kg)   SpO2 95%   BMI 28.41 kg/m  No data found.   Physical Exam  Constitutional: He is oriented to person, place, and time. He appears well-developed and well-nourished. No distress.  HENT:  Head: Normocephalic and atraumatic.  Right Ear: Tympanic membrane normal.  Left Ear: Tympanic membrane normal.  Nose: Nose normal.  Mouth/Throat: Uvula is midline, oropharynx is clear and moist and mucous membranes are normal.  Eyes: EOM are normal.  Neck: Normal range of motion. Neck supple.  Cardiovascular: Normal rate and regular rhythm.   Pulmonary/Chest: Effort normal and breath sounds normal. No stridor. No respiratory distress. He has no wheezes. He has no rales.  Musculoskeletal: Normal range of motion.  Lymphadenopathy:    He has no cervical adenopathy.  Neurological: He is alert and oriented to person, place, and time.  Skin: Skin is warm and dry. He is not diaphoretic.  Psychiatric: He has a normal mood and affect. His behavior is normal.  Nursing note and vitals reviewed.   Urgent Care Course     Procedures (including critical care time)  Labs Review Labs Reviewed - No data to display  Imaging Review No results found.    MDM   1. Viral URI with cough    Pt c/o 3 days of URI symptoms. Symptoms not concerning for influenza. No evidence of underlying  bacterial infection at this time. Encouraged symptomatic treatment as symptoms likely viral in nature, such as common cold.  Rx: Tessalon and ipratropium nasal spray  Encouraged f/u with PCP in 1 week if not improving.    Noland Fordyce, PA-C 06/22/16 1831

## 2016-06-22 NOTE — ED Triage Notes (Signed)
Pt c/o cough x 3 days

## 2016-06-22 NOTE — Discharge Instructions (Signed)
°

## 2017-04-19 DIAGNOSIS — H59813 Chorioretinal scars after surgery for detachment, bilateral: Secondary | ICD-10-CM | POA: Diagnosis not present

## 2017-04-19 DIAGNOSIS — H5213 Myopia, bilateral: Secondary | ICD-10-CM | POA: Diagnosis not present

## 2017-05-06 DIAGNOSIS — H33333 Multiple defects of retina without detachment, bilateral: Secondary | ICD-10-CM | POA: Diagnosis not present

## 2017-05-06 DIAGNOSIS — H31093 Other chorioretinal scars, bilateral: Secondary | ICD-10-CM | POA: Diagnosis not present

## 2017-05-06 DIAGNOSIS — H35413 Lattice degeneration of retina, bilateral: Secondary | ICD-10-CM | POA: Diagnosis not present

## 2017-05-12 DIAGNOSIS — M545 Low back pain: Secondary | ICD-10-CM | POA: Diagnosis not present

## 2017-05-12 DIAGNOSIS — Z6830 Body mass index (BMI) 30.0-30.9, adult: Secondary | ICD-10-CM | POA: Diagnosis not present

## 2017-05-12 DIAGNOSIS — Z87891 Personal history of nicotine dependence: Secondary | ICD-10-CM | POA: Diagnosis not present

## 2017-05-12 DIAGNOSIS — R030 Elevated blood-pressure reading, without diagnosis of hypertension: Secondary | ICD-10-CM | POA: Diagnosis not present

## 2017-05-12 DIAGNOSIS — E782 Mixed hyperlipidemia: Secondary | ICD-10-CM | POA: Diagnosis not present

## 2017-05-12 DIAGNOSIS — R5383 Other fatigue: Secondary | ICD-10-CM | POA: Diagnosis not present

## 2017-05-12 DIAGNOSIS — M546 Pain in thoracic spine: Secondary | ICD-10-CM | POA: Diagnosis not present

## 2017-05-12 DIAGNOSIS — R0989 Other specified symptoms and signs involving the circulatory and respiratory systems: Secondary | ICD-10-CM | POA: Diagnosis not present

## 2017-05-12 MED FILL — LEVOCETIRIZINE 5 MG TABLET: 5 | 90 days supply | Qty: 90 | Fill #0

## 2017-05-12 MED FILL — ATORVASTATIN 40 MG TABLET: 40 | 90 days supply | Qty: 90 | Fill #0

## 2017-05-12 MED FILL — EZETIMIBE 10 MG TABS: 10 | 90 days supply | Qty: 90 | Fill #0

## 2017-07-18 DIAGNOSIS — H35413 Lattice degeneration of retina, bilateral: Secondary | ICD-10-CM | POA: Diagnosis not present

## 2017-07-18 DIAGNOSIS — H33333 Multiple defects of retina without detachment, bilateral: Secondary | ICD-10-CM | POA: Diagnosis not present

## 2017-08-01 DIAGNOSIS — Z6831 Body mass index (BMI) 31.0-31.9, adult: Secondary | ICD-10-CM | POA: Diagnosis not present

## 2017-08-01 DIAGNOSIS — R030 Elevated blood-pressure reading, without diagnosis of hypertension: Secondary | ICD-10-CM | POA: Diagnosis not present

## 2017-08-01 DIAGNOSIS — E782 Mixed hyperlipidemia: Secondary | ICD-10-CM | POA: Diagnosis not present

## 2017-08-01 DIAGNOSIS — R5383 Other fatigue: Secondary | ICD-10-CM | POA: Diagnosis not present

## 2017-08-01 MED FILL — LEVOCETIRIZINE 5 MG TABLET: 5 | 90 days supply | Qty: 90 | Fill #1

## 2017-08-01 MED FILL — ATORVASTATIN 40 MG TABLET: 40 | 90 days supply | Qty: 90 | Fill #0

## 2017-08-01 MED FILL — CYCLOBENZAPRINE HCL 10 MG T: 10 | 10 days supply | Qty: 30 | Fill #0

## 2017-08-01 MED FILL — FENOFIBRATE 160 MG TABLET: 160 | 90 days supply | Qty: 90 | Fill #0

## 2017-08-07 DIAGNOSIS — M25511 Pain in right shoulder: Secondary | ICD-10-CM | POA: Diagnosis not present

## 2017-08-07 DIAGNOSIS — M542 Cervicalgia: Secondary | ICD-10-CM | POA: Diagnosis not present

## 2017-08-15 DIAGNOSIS — H33332 Multiple defects of retina without detachment, left eye: Secondary | ICD-10-CM | POA: Diagnosis not present

## 2017-08-15 DIAGNOSIS — H35412 Lattice degeneration of retina, left eye: Secondary | ICD-10-CM | POA: Diagnosis not present

## 2017-08-15 MED FILL — LOTEMAX 0.5% EYE DROPS: 0.5 | 90 days supply | Qty: 5 | Fill #0

## 2017-08-16 DIAGNOSIS — M5412 Radiculopathy, cervical region: Secondary | ICD-10-CM | POA: Diagnosis not present

## 2017-08-22 DIAGNOSIS — M542 Cervicalgia: Secondary | ICD-10-CM | POA: Diagnosis not present

## 2017-08-22 MED FILL — CYCLOBENZAPRINE HCL 10 MG T: 10 | 10 days supply | Qty: 30 | Fill #1

## 2017-08-30 ENCOUNTER — Other Ambulatory Visit: Payer: Self-pay | Admitting: Sports Medicine

## 2017-08-30 DIAGNOSIS — M542 Cervicalgia: Secondary | ICD-10-CM

## 2017-08-31 DIAGNOSIS — M542 Cervicalgia: Secondary | ICD-10-CM | POA: Diagnosis not present

## 2017-08-31 DIAGNOSIS — R030 Elevated blood-pressure reading, without diagnosis of hypertension: Secondary | ICD-10-CM | POA: Diagnosis not present

## 2017-08-31 DIAGNOSIS — M25511 Pain in right shoulder: Secondary | ICD-10-CM | POA: Diagnosis not present

## 2017-08-31 DIAGNOSIS — Z6831 Body mass index (BMI) 31.0-31.9, adult: Secondary | ICD-10-CM | POA: Diagnosis not present

## 2017-08-31 MED FILL — HYDROCODON-APAP 7.5-325: 7.5-325 | 7 days supply | Qty: 21 | Fill #0

## 2017-09-09 ENCOUNTER — Ambulatory Visit
Admission: RE | Admit: 2017-09-09 | Discharge: 2017-09-09 | Disposition: A | Discharge status: Home / Self Care | Payer: 59 | Source: Ambulatory Visit | Attending: Sports Medicine | Admitting: Sports Medicine

## 2017-09-09 DIAGNOSIS — M5412 Radiculopathy, cervical region: Secondary | ICD-10-CM | POA: Diagnosis not present

## 2017-09-09 DIAGNOSIS — M542 Cervicalgia: Secondary | ICD-10-CM

## 2017-09-09 MED ORDER — IOPAMIDOL (ISOVUE-M 300) INJECTION 61%
1.0000 mL | Freq: Once | INTRAMUSCULAR | Status: AC | PRN
  Administered 2017-09-09: 1 mL via EPIDURAL

## 2017-09-09 MED ORDER — TRIAMCINOLONE ACETONIDE 40 MG/ML IJ SUSP (RADIOLOGY)
60.0000 mg | Freq: Once | INTRAMUSCULAR | Status: AC
  Administered 2017-09-09: 60 mg via EPIDURAL

## 2017-09-09 NOTE — Discharge Instructions (Signed)

## 2017-09-12 MED FILL — CYCLOBENZAPRINE HCL 10 MG T: 10 | 10 days supply | Qty: 30 | Fill #2

## 2017-09-16 DIAGNOSIS — H35413 Lattice degeneration of retina, bilateral: Secondary | ICD-10-CM | POA: Diagnosis not present

## 2017-09-16 DIAGNOSIS — H31093 Other chorioretinal scars, bilateral: Secondary | ICD-10-CM | POA: Diagnosis not present

## 2017-09-30 ENCOUNTER — Other Ambulatory Visit: Payer: Self-pay | Admitting: Sports Medicine

## 2017-09-30 DIAGNOSIS — M542 Cervicalgia: Secondary | ICD-10-CM

## 2017-10-12 ENCOUNTER — Ambulatory Visit
Admission: RE | Admit: 2017-10-12 | Discharge: 2017-10-12 | Disposition: A | Discharge status: Home / Self Care | Payer: 59 | Source: Ambulatory Visit | Attending: Sports Medicine | Admitting: Sports Medicine

## 2017-10-12 DIAGNOSIS — M542 Cervicalgia: Secondary | ICD-10-CM

## 2017-10-12 DIAGNOSIS — M47812 Spondylosis without myelopathy or radiculopathy, cervical region: Secondary | ICD-10-CM | POA: Diagnosis not present

## 2017-10-12 MED ORDER — TRIAMCINOLONE ACETONIDE 40 MG/ML IJ SUSP (RADIOLOGY)
60.0000 mg | Freq: Once | INTRAMUSCULAR | Status: AC
  Administered 2017-10-12: 60 mg via EPIDURAL

## 2017-10-12 MED ORDER — IOPAMIDOL (ISOVUE-M 300) INJECTION 61%
1.0000 mL | Freq: Once | INTRAMUSCULAR | Status: AC | PRN
  Administered 2017-10-12: 1 mL via EPIDURAL

## 2017-10-12 NOTE — Discharge Instructions (Signed)

## 2017-11-02 DIAGNOSIS — E782 Mixed hyperlipidemia: Secondary | ICD-10-CM | POA: Diagnosis not present

## 2017-11-02 DIAGNOSIS — I10 Essential (primary) hypertension: Secondary | ICD-10-CM | POA: Diagnosis not present

## 2017-11-02 DIAGNOSIS — F5221 Male erectile disorder: Secondary | ICD-10-CM | POA: Diagnosis not present

## 2017-11-02 DIAGNOSIS — Z6830 Body mass index (BMI) 30.0-30.9, adult: Secondary | ICD-10-CM | POA: Diagnosis not present

## 2017-11-02 MED FILL — LEVOCETIRIZINE 5 MG TABLET: 5 | 90 days supply | Qty: 90 | Fill #0

## 2017-11-02 MED FILL — ATORVASTATIN 40 MG TABLET: 40 | 90 days supply | Qty: 90 | Fill #0

## 2017-11-02 MED FILL — FENOFIBRATE 160 MG TABLET: 160 | 90 days supply | Qty: 90 | Fill #0

## 2018-01-20 ENCOUNTER — Other Ambulatory Visit: Payer: Self-pay | Admitting: Sports Medicine

## 2018-01-20 DIAGNOSIS — M542 Cervicalgia: Secondary | ICD-10-CM

## 2018-01-25 DIAGNOSIS — R030 Elevated blood-pressure reading, without diagnosis of hypertension: Secondary | ICD-10-CM | POA: Diagnosis not present

## 2018-01-25 DIAGNOSIS — M542 Cervicalgia: Secondary | ICD-10-CM | POA: Diagnosis not present

## 2018-01-25 DIAGNOSIS — Z6831 Body mass index (BMI) 31.0-31.9, adult: Secondary | ICD-10-CM | POA: Diagnosis not present

## 2018-02-01 MED FILL — ATORVASTATIN 40 MG TABLET: 40 | 90 days supply | Qty: 90 | Fill #1

## 2018-02-14 ENCOUNTER — Other Ambulatory Visit: Payer: 59

## 2018-02-20 ENCOUNTER — Ambulatory Visit
Admission: RE | Admit: 2018-02-20 | Discharge: 2018-02-20 | Disposition: A | Discharge status: Home / Self Care | Payer: 59 | Source: Ambulatory Visit | Attending: Sports Medicine | Admitting: Sports Medicine

## 2018-02-20 DIAGNOSIS — M47812 Spondylosis without myelopathy or radiculopathy, cervical region: Secondary | ICD-10-CM | POA: Diagnosis not present

## 2018-02-20 DIAGNOSIS — M542 Cervicalgia: Secondary | ICD-10-CM

## 2018-02-20 MED ORDER — TRIAMCINOLONE ACETONIDE 40 MG/ML IJ SUSP (RADIOLOGY)
60.0000 mg | Freq: Once | INTRAMUSCULAR | Status: AC
  Administered 2018-02-20: 60 mg via EPIDURAL

## 2018-02-20 MED ORDER — IOPAMIDOL (ISOVUE-M 300) INJECTION 61%
1.0000 mL | Freq: Once | INTRAMUSCULAR | Status: AC | PRN
Start: 2018-02-20 — End: 2018-02-20
  Administered 2018-02-20: 1 mL via EPIDURAL

## 2018-02-20 NOTE — Discharge Instructions (Signed)

## 2018-03-06 DIAGNOSIS — Z6830 Body mass index (BMI) 30.0-30.9, adult: Secondary | ICD-10-CM | POA: Diagnosis not present

## 2018-03-06 DIAGNOSIS — N400 Enlarged prostate without lower urinary tract symptoms: Secondary | ICD-10-CM | POA: Diagnosis not present

## 2018-03-06 DIAGNOSIS — E782 Mixed hyperlipidemia: Secondary | ICD-10-CM | POA: Diagnosis not present

## 2018-03-06 DIAGNOSIS — Z79899 Other long term (current) drug therapy: Secondary | ICD-10-CM | POA: Diagnosis not present

## 2018-03-06 DIAGNOSIS — Z0000 Encounter for general adult medical examination without abnormal findings: Secondary | ICD-10-CM | POA: Diagnosis not present

## 2018-03-06 DIAGNOSIS — R030 Elevated blood-pressure reading, without diagnosis of hypertension: Secondary | ICD-10-CM | POA: Diagnosis not present

## 2018-03-06 DIAGNOSIS — M5126 Other intervertebral disc displacement, lumbar region: Secondary | ICD-10-CM | POA: Diagnosis not present

## 2018-03-06 DIAGNOSIS — F5221 Male erectile disorder: Secondary | ICD-10-CM | POA: Diagnosis not present

## 2018-03-06 DIAGNOSIS — Z125 Encounter for screening for malignant neoplasm of prostate: Secondary | ICD-10-CM | POA: Diagnosis not present

## 2018-03-06 MED FILL — EZETIMIBE 10 MG TABLET: 10 | 90 days supply | Qty: 90 | Fill #0

## 2018-03-06 MED FILL — FENOFIBRATE 160 MG TABLET: 160 | 90 days supply | Qty: 90 | Fill #0

## 2018-04-07 DIAGNOSIS — H0011 Chalazion right upper eyelid: Secondary | ICD-10-CM | POA: Diagnosis not present

## 2018-04-07 DIAGNOSIS — H33333 Multiple defects of retina without detachment, bilateral: Secondary | ICD-10-CM | POA: Diagnosis not present

## 2018-04-07 DIAGNOSIS — H35413 Lattice degeneration of retina, bilateral: Secondary | ICD-10-CM | POA: Diagnosis not present

## 2018-04-07 DIAGNOSIS — H31093 Other chorioretinal scars, bilateral: Secondary | ICD-10-CM | POA: Diagnosis not present

## 2018-04-24 DIAGNOSIS — H524 Presbyopia: Secondary | ICD-10-CM | POA: Diagnosis not present

## 2018-04-27 MED FILL — XIIDRA 5% EYE DROPS: 5 | 30 days supply | Qty: 60 | Fill #0

## 2018-05-11 ENCOUNTER — Ambulatory Visit: Payer: Self-pay | Admitting: Family Medicine

## 2018-05-11 VITALS — BP 120/85 | HR 81 | Temp 98.1°F | Resp 16 | Wt 178.6 lb

## 2018-05-11 DIAGNOSIS — R059 Cough, unspecified: Secondary | ICD-10-CM

## 2018-05-11 DIAGNOSIS — R05 Cough: Secondary | ICD-10-CM

## 2018-05-11 MED ORDER — BENZONATATE 100 MG PO CAPS: 100.0000 mg | ORAL_CAPSULE | Freq: Three times a day (TID) | ORAL | 0 refills | Status: AC | PRN

## 2018-05-11 MED ORDER — AZELASTINE HCL 0.1 % NA SOLN: 1.0000 | Freq: Two times a day (BID) | NASAL | 0 refills | Status: AC

## 2018-05-11 MED FILL — BENZONATATE 100 MG CAP: 100 | 6 days supply | Qty: 20 | Fill #0

## 2018-05-11 MED FILL — AZELASTINE HCL 137 MCG SPRY: 0.1 | 50 days supply | Qty: 30 | Fill #0

## 2018-05-11 NOTE — Patient Instructions (Signed)
Cough, Adult  Coughing is a reflex that clears your throat and your airways. Coughing helps to heal and protect your lungs. It is normal to cough occasionally, but a cough that happens with other symptoms or lasts a long time may be a sign of a condition that needs treatment. A cough may last only 2-3 weeks (acute), or it may last longer than 8 weeks (chronic). What are the causes? Coughing is commonly caused by:  Breathing in substances that irritate your lungs.  A viral or bacterial respiratory infection.  Allergies.  Asthma.  Postnasal drip.  Smoking.  Acid backing up from the stomach into the esophagus (gastroesophageal reflux).  Certain medicines.  Chronic lung problems, including COPD (or rarely, lung cancer).  Other medical conditions such as heart failure. Follow these instructions at home: Pay attention to any changes in your symptoms. Take these actions to help with your discomfort:  Take medicines only as told by your health care provider. ? If you were prescribed an antibiotic medicine, take it as told by your health care provider. Do not stop taking the antibiotic even if you start to feel better. ? Talk with your health care provider before you take a cough suppressant medicine.  Drink enough fluid to keep your urine clear or pale yellow.  If the air is dry, use a cold steam vaporizer or humidifier in your bedroom or your home to help loosen secretions.  Avoid anything that causes you to cough at work or at home.  If your cough is worse at night, try sleeping in a semi-upright position.  Avoid cigarette smoke. If you smoke, quit smoking. If you need help quitting, ask your health care provider.  Avoid caffeine.  Avoid alcohol.  Rest as needed. Contact a health care provider if:  You have new symptoms.  You cough up pus.  Your cough does not get better after 2-3 weeks, or your cough gets worse.  You cannot control your cough with suppressant  medicines and you are losing sleep.  You develop pain that is getting worse or pain that is not controlled with pain medicines.  You have a fever.  You have unexplained weight loss.  You have night sweats. Get help right away if:  You cough up blood.  You have difficulty breathing.  Your heartbeat is very fast. This information is not intended to replace advice given to you by your health care provider. Make sure you discuss any questions you have with your health care provider. Document Released: 10/30/2010 Document Revised: 10/09/2015 Document Reviewed: 07/10/2014 Elsevier Interactive Patient Education  2019 [employer] Inc[employer]  

## 2018-05-11 NOTE — Progress Notes (Signed)
Caleb Beck is a 52 y.o. male who presents today with concerns of cough for the last 48 hours. He has used over the counter NyQuil and Delsym with only minimal relief. He has seasonal allergies that he reports are not giving him trouble at this time and he is not taking any medication for. He denies GERD or any diet changes making him more at risk for heart burn. He denies any fever or chills or known sick contacts with similar symptoms.  Review of Systems  Constitutional: Negative for chills, fever and malaise/fatigue.  HENT: Negative for congestion, ear discharge, ear pain, sinus pain and sore throat.   Eyes: Negative.   Respiratory: Positive for cough. Negative for sputum production and shortness of breath.   Cardiovascular: Negative.  Negative for chest pain.  Gastrointestinal: Negative for abdominal pain, diarrhea, nausea and vomiting.  Genitourinary: Negative for dysuria, frequency, hematuria and urgency.  Musculoskeletal: Negative for myalgias.  Skin: Negative.   Neurological: Negative for headaches.  Endo/Heme/Allergies: Negative.   Psychiatric/Behavioral: Negative.     O: Vitals:   05/11/18 1149  BP: 120/85  Pulse: 81  Resp: 16  Temp: 98.1 F (36.7 C)  SpO2: 94%     Physical Exam Vitals signs reviewed.  Constitutional:      Appearance: He is well-developed. He is not toxic-appearing.  HENT:     Head: Normocephalic.     Right Ear: Hearing, tympanic membrane, ear canal and external ear normal.     Left Ear: Hearing, tympanic membrane, ear canal and external ear normal.     Nose: Nose normal.     Mouth/Throat:     Pharynx: Uvula midline.  Neck:     Musculoskeletal: Normal range of motion and neck supple.  Cardiovascular:     Rate and Rhythm: Normal rate and regular rhythm.     Pulses: Normal pulses.     Heart sounds: Normal heart sounds.  Pulmonary:     Effort: Pulmonary effort is normal.     Breath sounds: Normal breath sounds. No decreased breath sounds,  wheezing or rhonchi.     Comments: Dry non productive cough observed on exam. Abdominal:     General: Bowel sounds are normal.     Palpations: Abdomen is soft.  Musculoskeletal: Normal range of motion.  Lymphadenopathy:     Head:     Right side of head: No submental or submandibular adenopathy.     Left side of head: No submental or submandibular adenopathy.     Cervical: No cervical adenopathy.  Neurological:     Mental Status: He is alert and oriented to person, place, and time.    A: 1. Cough    P: Discussed exam findings, diagnosis etiology and medication use and indications reviewed with patient. Follow- Up and discharge instructions provided. No emergent/urgent issues found on exam.  Patient verbalized understanding of information provided and agrees with plan of care (POC), all questions answered.  1. Cough Exam unremarkable discussed symptomatic management options and supportive care-patient education on cough provided.  - benzonatate (TESSALON) 100 MG capsule; Take 1-2 capsules (100-200 mg total) by mouth 3 (three) times daily as needed for cough. - azelastine (ASTELIN) 0.1 % nasal spray; Place 1 spray into both nostrils 2 (two) times daily. Use in each nostril as directed

## 2018-05-26 MED FILL — FENOFIBRATE 160 MG TABLET: 160 | 90 days supply | Qty: 90 | Fill #1

## 2018-05-29 DIAGNOSIS — R030 Elevated blood-pressure reading, without diagnosis of hypertension: Secondary | ICD-10-CM | POA: Diagnosis not present

## 2018-05-29 DIAGNOSIS — R5383 Other fatigue: Secondary | ICD-10-CM | POA: Diagnosis not present

## 2018-05-29 DIAGNOSIS — E782 Mixed hyperlipidemia: Secondary | ICD-10-CM | POA: Diagnosis not present

## 2018-05-29 DIAGNOSIS — M5126 Other intervertebral disc displacement, lumbar region: Secondary | ICD-10-CM | POA: Diagnosis not present

## 2018-05-29 DIAGNOSIS — Z6831 Body mass index (BMI) 31.0-31.9, adult: Secondary | ICD-10-CM | POA: Diagnosis not present

## 2018-05-29 MED FILL — BENZONATATE 100 MG CAP: 100 | 30 days supply | Qty: 90 | Fill #0

## 2018-05-29 MED FILL — ATORVASTATIN 40 MG TABLET: 40 | 90 days supply | Qty: 90 | Fill #0

## 2018-07-04 MED FILL — XIIDRA 5% EYE DROPS: 5 | 30 days supply | Qty: 60 | Fill #1

## 2018-08-16 MED FILL — EZETIMIBE 10 MG TABS: 10 | 90 days supply | Qty: 90 | Fill #0

## 2018-08-16 MED FILL — ATORVASTATIN 40 MG TABLET: 40 | 90 days supply | Qty: 90 | Fill #0

## 2018-08-16 MED FILL — FENOFIBRATE 160 MG TABLET: 160 | 90 days supply | Qty: 90 | Fill #0

## 2018-09-07 MED FILL — CLINDAMYCIN HCL 300 MG CAPS: 300 | 10 days supply | Qty: 30 | Fill #0

## 2018-09-07 MED FILL — HYDROCODON-APAP 7.5-325: 7.5-325 | 2 days supply | Qty: 12 | Fill #0

## 2018-09-08 MED FILL — XIIDRA 5% EYE DROPS: 5 | 90 days supply | Qty: 180 | Fill #0

## 2018-09-28 ENCOUNTER — Other Ambulatory Visit: Payer: Self-pay | Admitting: *Deleted

## 2018-09-28 ENCOUNTER — Other Ambulatory Visit: Payer: Self-pay | Admitting: Family Medicine

## 2018-09-28 DIAGNOSIS — Z6830 Body mass index (BMI) 30.0-30.9, adult: Secondary | ICD-10-CM | POA: Diagnosis not present

## 2018-09-28 DIAGNOSIS — R5383 Other fatigue: Secondary | ICD-10-CM | POA: Diagnosis not present

## 2018-09-28 DIAGNOSIS — R030 Elevated blood-pressure reading, without diagnosis of hypertension: Secondary | ICD-10-CM | POA: Diagnosis not present

## 2018-09-28 DIAGNOSIS — I998 Other disorder of circulatory system: Secondary | ICD-10-CM

## 2018-09-28 DIAGNOSIS — E782 Mixed hyperlipidemia: Secondary | ICD-10-CM | POA: Diagnosis not present

## 2018-10-01 ENCOUNTER — Other Ambulatory Visit: Payer: Self-pay

## 2018-10-01 ENCOUNTER — Ambulatory Visit (INDEPENDENT_AMBULATORY_CARE_PROVIDER_SITE_OTHER): Payer: 59

## 2018-10-01 DIAGNOSIS — I998 Other disorder of circulatory system: Secondary | ICD-10-CM | POA: Diagnosis not present

## 2018-10-01 DIAGNOSIS — I6789 Other cerebrovascular disease: Secondary | ICD-10-CM | POA: Diagnosis not present

## 2018-10-17 DIAGNOSIS — L821 Other seborrheic keratosis: Secondary | ICD-10-CM | POA: Diagnosis not present

## 2018-10-17 DIAGNOSIS — K130 Diseases of lips: Secondary | ICD-10-CM | POA: Diagnosis not present

## 2018-10-19 MED FILL — NYSTATIN-TRIAMCINOLONE OINT: 100000-0.1 | 30 days supply | Qty: 30 | Fill #0

## 2018-11-08 MED FILL — FENOFIBRATE 160 MG TABLET: 160 | 90 days supply | Qty: 90 | Fill #1

## 2018-11-09 MED FILL — EZETIMIBE 10 MG TABS: 10 | 90 days supply | Qty: 90 | Fill #0

## 2018-11-24 MED FILL — ATORVASTATIN 40 MG TABLET: 40 | 90 days supply | Qty: 90 | Fill #0

## 2018-12-29 DIAGNOSIS — E782 Mixed hyperlipidemia: Secondary | ICD-10-CM | POA: Diagnosis not present

## 2018-12-29 DIAGNOSIS — M5126 Other intervertebral disc displacement, lumbar region: Secondary | ICD-10-CM | POA: Diagnosis not present

## 2018-12-29 DIAGNOSIS — Z6830 Body mass index (BMI) 30.0-30.9, adult: Secondary | ICD-10-CM | POA: Diagnosis not present

## 2018-12-29 DIAGNOSIS — R030 Elevated blood-pressure reading, without diagnosis of hypertension: Secondary | ICD-10-CM | POA: Diagnosis not present

## 2018-12-29 DIAGNOSIS — R5383 Other fatigue: Secondary | ICD-10-CM | POA: Diagnosis not present

## 2018-12-29 MED FILL — CYCLOBENZAPRINE HCL 10 MG T: 10 | 10 days supply | Qty: 20 | Fill #0

## 2019-02-06 MED FILL — FENOFIBRATE 160 MG TABLET: 160 | 90 days supply | Qty: 90 | Fill #0

## 2019-02-22 MED FILL — ATORVASTATIN 40 MG TABLET: 40 | 90 days supply | Qty: 90 | Fill #1

## 2019-03-28 MED FILL — XIIDRA 5 % SOLN: 5 | 90 days supply | Qty: 180 | Fill #1

## 2019-04-04 DIAGNOSIS — R030 Elevated blood-pressure reading, without diagnosis of hypertension: Secondary | ICD-10-CM | POA: Diagnosis not present

## 2019-04-04 DIAGNOSIS — M5126 Other intervertebral disc displacement, lumbar region: Secondary | ICD-10-CM | POA: Diagnosis not present

## 2019-04-04 DIAGNOSIS — Z0000 Encounter for general adult medical examination without abnormal findings: Secondary | ICD-10-CM | POA: Diagnosis not present

## 2019-04-04 DIAGNOSIS — R5383 Other fatigue: Secondary | ICD-10-CM | POA: Diagnosis not present

## 2019-04-04 DIAGNOSIS — E782 Mixed hyperlipidemia: Secondary | ICD-10-CM | POA: Diagnosis not present

## 2019-04-04 DIAGNOSIS — Z6831 Body mass index (BMI) 31.0-31.9, adult: Secondary | ICD-10-CM | POA: Diagnosis not present

## 2019-04-04 DIAGNOSIS — N400 Enlarged prostate without lower urinary tract symptoms: Secondary | ICD-10-CM | POA: Diagnosis not present

## 2019-04-04 MED FILL — HYDROCODONE-HOMATROPINE SYR: 5-1.5 | 6 days supply | Qty: 120 | Fill #0

## 2019-04-04 MED FILL — EZETIMIBE 10 MG TABS: 10 | 90 days supply | Qty: 90 | Fill #0

## 2019-04-09 DIAGNOSIS — H35413 Lattice degeneration of retina, bilateral: Secondary | ICD-10-CM | POA: Diagnosis not present

## 2019-04-09 DIAGNOSIS — H31093 Other chorioretinal scars, bilateral: Secondary | ICD-10-CM | POA: Diagnosis not present

## 2019-05-02 DIAGNOSIS — H524 Presbyopia: Secondary | ICD-10-CM | POA: Diagnosis not present

## 2019-06-12 MED FILL — FENOFIBRATE 160 MG TABLET: 160 | 90 days supply | Qty: 90 | Fill #0

## 2019-06-12 MED FILL — ATORVASTATIN 40 MG TABLET: 40 | 90 days supply | Qty: 90 | Fill #0

## 2019-07-27 ENCOUNTER — Ambulatory Visit: Payer: 59 | Attending: Internal Medicine

## 2019-07-27 DIAGNOSIS — Z23 Encounter for immunization: Secondary | ICD-10-CM

## 2019-07-27 NOTE — Progress Notes (Signed)
   Covid-19 Vaccination Clinic  Name:  Caleb Beck    MRN: [id number] DOB: [birth year]  07/27/2019  Caleb Beck was observed post Covid-19 immunization for 15 minutes without incident. He was provided with Vaccine Information Sheet and instruction to access the V-Safe system.   Caleb Beck was instructed to call 911 with any severe reactions post vaccine: Marland Kitchen Difficulty breathing  . Swelling of face and throat  . A fast heartbeat  . A bad rash all over body  . Dizziness and weakness   Immunizations Administered    Name Date Dose VIS Date Route   Pfizer COVID-19 Vaccine 07/27/2019  4:00 PM 0.3 mL 04/27/2019 Intramuscular   Manufacturer: [place]   Lot: [id number]   [place]: [id number]

## 2019-08-20 ENCOUNTER — Ambulatory Visit: Payer: 59

## 2019-08-29 ENCOUNTER — Ambulatory Visit: Payer: 59 | Attending: Internal Medicine

## 2019-08-29 DIAGNOSIS — Z23 Encounter for immunization: Secondary | ICD-10-CM

## 2019-08-29 NOTE — Progress Notes (Signed)
   Covid-19 Vaccination Clinic  Name:  Caleb Beck    MRN: [id number] DOB: July 02, 1965  08/29/2019  Caleb Beck was observed post Covid-19 immunization for 15 minutes without incident. He was provided with Vaccine Information Sheet and instruction to access the V-Safe system.   Caleb Beck was instructed to call 911 with any severe reactions post vaccine: Marland Kitchen Difficulty breathing  . Swelling of face and throat  . A fast heartbeat  . A bad rash all over body  . Dizziness and weakness   Immunizations Administered    Name Date Dose VIS Date Route   Pfizer COVID-19 Vaccine 08/29/2019 11:50 AM 0.3 mL 04/27/2019 Intramuscular   Manufacturer: [place]   Lot: [id number]   [place]: [id number]

## 2019-09-03 MED FILL — XIIDRA 5 % SOLN: 5 | 90 days supply | Qty: 180 | Fill #2

## 2019-09-04 MED FILL — ATORVASTATIN 40 MG TABLET: 40 | 90 days supply | Qty: 90 | Fill #1

## 2019-09-04 MED FILL — FENOFIBRATE 160 MG TABLET: 160 | 90 days supply | Qty: 90 | Fill #1

## 2019-09-12 ENCOUNTER — Other Ambulatory Visit (HOSPITAL_BASED_OUTPATIENT_CLINIC_OR_DEPARTMENT_OTHER): Payer: Self-pay

## 2019-09-12 DIAGNOSIS — R05 Cough: Secondary | ICD-10-CM | POA: Diagnosis not present

## 2019-09-12 DIAGNOSIS — Z6831 Body mass index (BMI) 31.0-31.9, adult: Secondary | ICD-10-CM | POA: Diagnosis not present

## 2019-09-12 DIAGNOSIS — Z0130 Encounter for examination of blood pressure without abnormal findings: Secondary | ICD-10-CM | POA: Diagnosis not present

## 2019-09-12 DIAGNOSIS — J302 Other seasonal allergic rhinitis: Secondary | ICD-10-CM | POA: Diagnosis not present

## 2019-09-12 MED FILL — FLUTICASONE PROP 50 MCG SPR: 50 | 60 days supply | Qty: 16 | Fill #0

## 2019-09-12 MED FILL — LEVOCETIRIZINE 5 MG TABLET: 5 | 30 days supply | Qty: 30 | Fill #0

## 2019-10-08 MED FILL — HYDROCODON-APAP 7.5-325: 7.5-325 | 2 days supply | Qty: 12 | Fill #0

## 2019-10-09 MED FILL — LEVOCETIRIZINE 5 MG TABLET: 5 | 30 days supply | Qty: 30 | Fill #1

## 2019-10-22 ENCOUNTER — Other Ambulatory Visit (HOSPITAL_BASED_OUTPATIENT_CLINIC_OR_DEPARTMENT_OTHER): Payer: Self-pay

## 2019-10-22 DIAGNOSIS — E782 Mixed hyperlipidemia: Secondary | ICD-10-CM | POA: Diagnosis not present

## 2019-10-22 DIAGNOSIS — R030 Elevated blood-pressure reading, without diagnosis of hypertension: Secondary | ICD-10-CM | POA: Diagnosis not present

## 2019-10-22 DIAGNOSIS — Z0130 Encounter for examination of blood pressure without abnormal findings: Secondary | ICD-10-CM | POA: Diagnosis not present

## 2019-10-22 DIAGNOSIS — M5126 Other intervertebral disc displacement, lumbar region: Secondary | ICD-10-CM | POA: Diagnosis not present

## 2019-10-22 DIAGNOSIS — F5221 Male erectile disorder: Secondary | ICD-10-CM | POA: Diagnosis not present

## 2019-10-22 DIAGNOSIS — Z6831 Body mass index (BMI) 31.0-31.9, adult: Secondary | ICD-10-CM | POA: Diagnosis not present

## 2019-10-22 DIAGNOSIS — J302 Other seasonal allergic rhinitis: Secondary | ICD-10-CM | POA: Diagnosis not present

## 2019-10-22 DIAGNOSIS — R5383 Other fatigue: Secondary | ICD-10-CM | POA: Diagnosis not present

## 2019-11-12 MED FILL — LEVOCETIRIZINE 5 MG TABLET: 5 | 30 days supply | Qty: 30 | Fill #2

## 2019-11-14 DIAGNOSIS — K219 Gastro-esophageal reflux disease without esophagitis: Secondary | ICD-10-CM | POA: Diagnosis not present

## 2019-11-14 DIAGNOSIS — Z86010 Personal history of colonic polyps: Secondary | ICD-10-CM | POA: Diagnosis not present

## 2019-11-14 MED FILL — OMEPRAZOLE 40 MG CPDR: 40 | 30 days supply | Qty: 30 | Fill #0

## 2019-11-20 IMAGING — XA Imaging study
2 series · 2 of 2 positions shown · non-contrast
Comparison: none

CLINICAL DATA: Right upper extremity radiculopathy.

[Series 1: ortho adipose · 1 of 1 slices shown (1 of 2)]
[im 1/1]
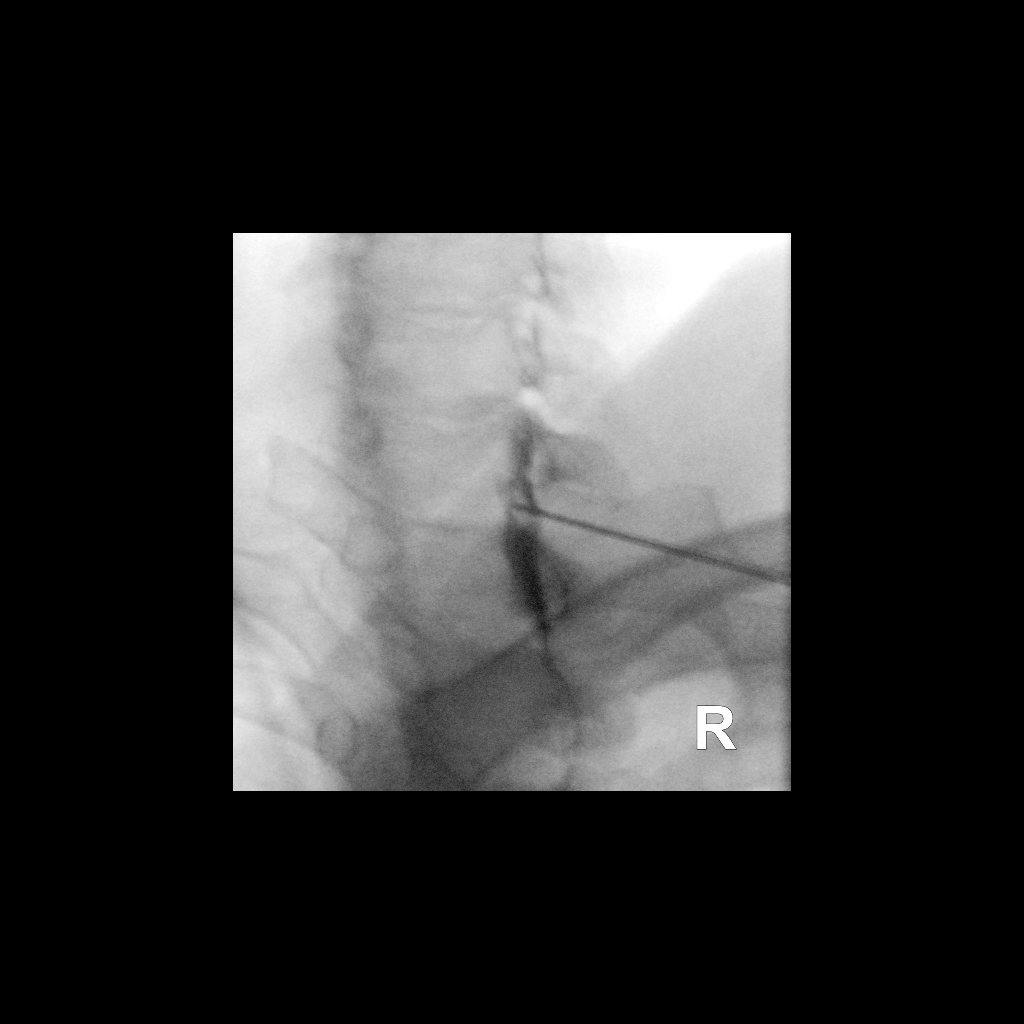

[Series 2: ortho adipose · 1 of 1 slices shown (2 of 2)]
[im 1/1]
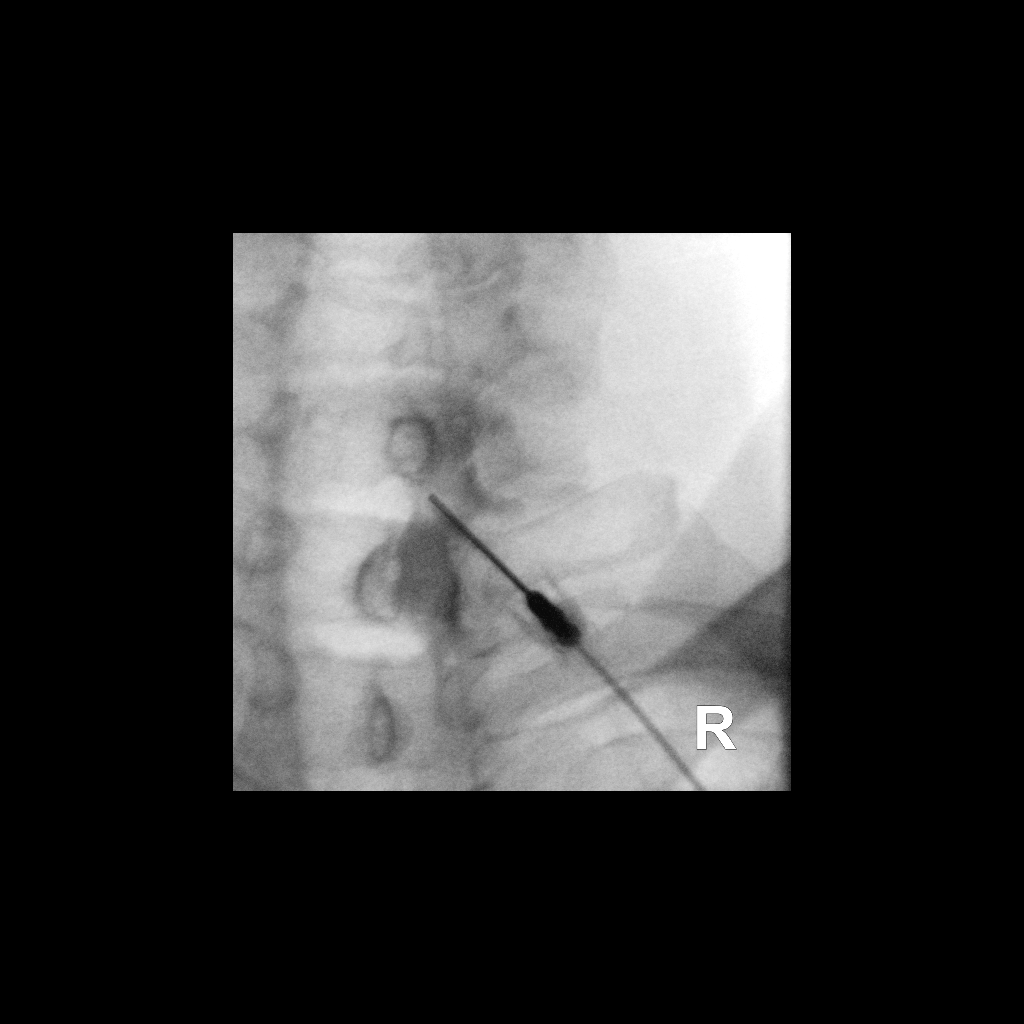

[2 of 2 positions shown; findings below may reference images not displayed]

FLUOROSCOPY TIME:  Radiation Exposure Index (as provided by the
fluoroscopic device): 5.85 uGy*m2

Fluoroscopy Time:  14 seconds

Number of Acquired Images:  0

PROCEDURE:
CERVICAL EPIDURAL INJECTION

An interlaminar approach was performed on the right at C7-T1. A 20
gauge epidural needle was advanced using loss-of-resistance
technique.

DIAGNOSTIC EPIDURAL INJECTION

Injection of Isovue-M 300 shows a good epidural pattern with spread
above and below the level of needle placement, primarily on the
right. No vascular opacification is seen. THERAPEUTIC

EPIDURAL INJECTION

1.5 ml of Kenalog 40 mixed with 1 ml of 1% Lidocaine and 2 ml of
normal saline were then instilled. The procedure was well-tolerated,
and the patient was discharged thirty minutes following the
injection in good condition.
IMPRESSION: Technically successful first epidural injection on the right at
C7-T1.

## 2019-12-04 MED FILL — FENOFIBRATE 160 MG TABLET: 160 | 90 days supply | Qty: 90 | Fill #0

## 2019-12-10 ENCOUNTER — Other Ambulatory Visit (HOSPITAL_BASED_OUTPATIENT_CLINIC_OR_DEPARTMENT_OTHER): Payer: Self-pay

## 2019-12-10 MED FILL — LEVOCETIRIZINE 5 MG TABLET: 5 | 30 days supply | Qty: 30 | Fill #0

## 2019-12-10 MED FILL — OMEPRAZOLE 40 MG CPDR: 40 | 30 days supply | Qty: 30 | Fill #1

## 2019-12-13 DIAGNOSIS — D122 Benign neoplasm of ascending colon: Secondary | ICD-10-CM | POA: Diagnosis not present

## 2019-12-13 DIAGNOSIS — F458 Other somatoform disorders: Secondary | ICD-10-CM | POA: Diagnosis not present

## 2019-12-13 DIAGNOSIS — D123 Benign neoplasm of transverse colon: Secondary | ICD-10-CM | POA: Diagnosis not present

## 2019-12-13 DIAGNOSIS — Z86010 Personal history of colonic polyps: Secondary | ICD-10-CM | POA: Diagnosis not present

## 2019-12-13 DIAGNOSIS — R12 Heartburn: Secondary | ICD-10-CM | POA: Diagnosis not present

## 2019-12-13 MED FILL — ATORVASTATIN CALCIUM 40 MG: 40 | 90 days supply | Qty: 90 | Fill #0

## 2019-12-20 MED FILL — FENOFIBRATE 160 MG TABLET: 160 | 90 days supply | Qty: 90 | Fill #0

## 2019-12-21 MED FILL — XIIDRA 5 % SOLN: 5 | 30 days supply | Qty: 60 | Fill #0

## 2019-12-23 IMAGING — XA DG INJECT/[PERSON_NAME] INC NEEDLE/CATH/PLC EPI/CERV/THOR W/IMG
3 series · 3 of 3 positions shown · non-contrast
Comparison: none

CLINICAL DATA: Cervical spondylosis without myelopathy. Significant
improvement after the first injection. We repeated it.

[Series 1: ortho adipose · 1 of 1 slices shown (1 of 3)]
[im 1/1]
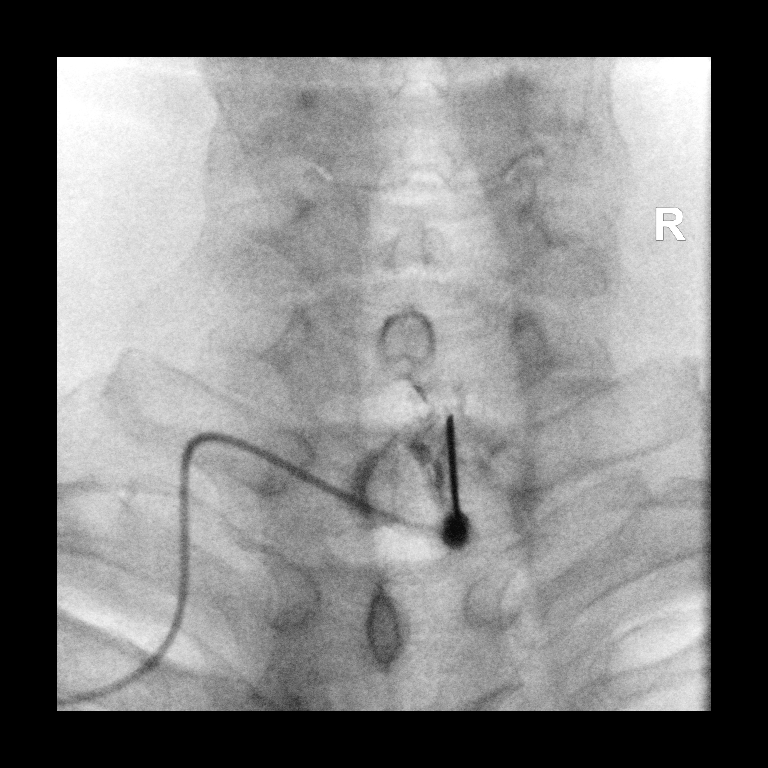

[Series 2: ortho adipose · 1 of 1 slices shown (2 of 3)]
[im 1/1]
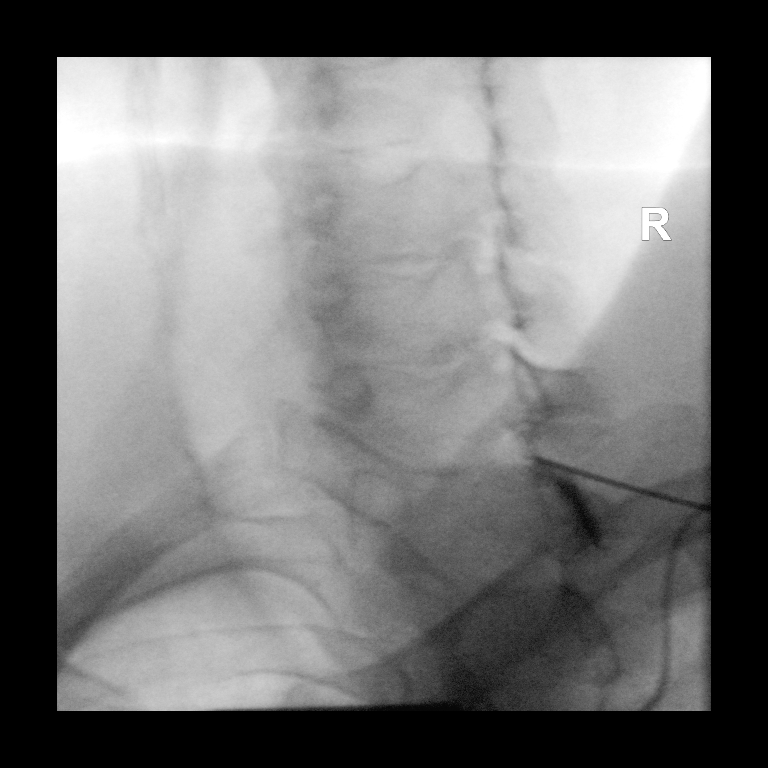

[Series 3: ortho adipose · 1 of 1 slices shown (3 of 3)]
[im 1/1]
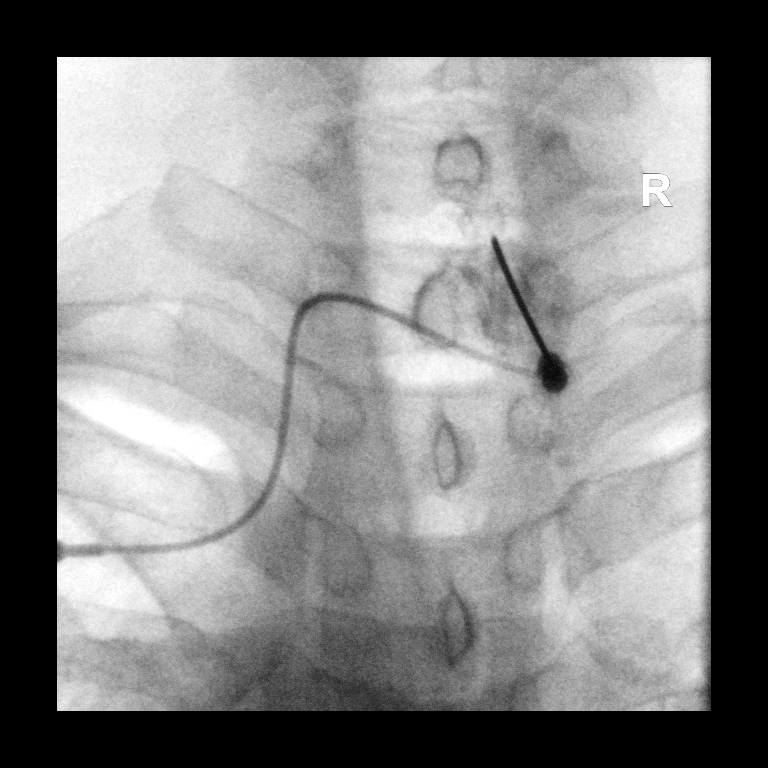

[3 of 3 positions shown; findings below may reference images not displayed]

FLUOROSCOPY TIME:  22 seconds corresponding to a Dose Area Product
of 11.9 Gy*m2

PROCEDURE:
CERVICAL EPIDURAL INJECTION

An interlaminar approach was performed on the RIGHT at C7-T1. A 20
gauge epidural needle was advanced using loss-of-resistance
technique.

DIAGNOSTIC EPIDURAL INJECTION

Injection of Isovue-M 300 shows a good epidural pattern with spread
above and below the level of needle placement, primarily on the
RIGHT. No vascular opacification is seen.

THERAPEUTIC EPIDURAL INJECTION

1.5 ml of Kenalog 40 mixed with 1 ml of 1% Lidocaine and 2 ml of
normal saline were then instilled. The procedure was well-tolerated,
and the patient was discharged thirty minutes following the
injection in good condition.
IMPRESSION: Technically successful second epidural injection on the RIGHT at
C7-T1.

## 2020-01-07 MED FILL — FLUTICASONE PROP 50 MCG SPR: 50 | 60 days supply | Qty: 16 | Fill #1

## 2020-03-07 MED FILL — FLUTICASONE PROP 50 MCG SPR: 50 | 60 days supply | Qty: 16 | Fill #2

## 2020-03-07 MED FILL — XIIDRA 5 % SOLN: 5 | 30 days supply | Qty: 60 | Fill #1

## 2020-03-07 MED FILL — LEVOCETIRIZINE 5 MG TABLET: 5 | 30 days supply | Qty: 30 | Fill #1

## 2020-03-17 MED FILL — FENOFIBRATE 160 MG TABLET: 160 | 90 days supply | Qty: 90 | Fill #1

## 2020-04-03 MED FILL — ATORVASTATIN CALCIUM 40 MG: 40 | 90 days supply | Qty: 90 | Fill #0

## 2020-04-16 ENCOUNTER — Other Ambulatory Visit (HOSPITAL_BASED_OUTPATIENT_CLINIC_OR_DEPARTMENT_OTHER): Payer: Self-pay

## 2020-04-16 DIAGNOSIS — Z0389 Encounter for observation for other suspected diseases and conditions ruled out: Secondary | ICD-10-CM | POA: Diagnosis not present

## 2020-04-16 DIAGNOSIS — R5383 Other fatigue: Secondary | ICD-10-CM | POA: Diagnosis not present

## 2020-04-16 DIAGNOSIS — R051 Acute cough: Secondary | ICD-10-CM | POA: Diagnosis not present

## 2020-04-16 MED FILL — BENZONATATE 100 MG CAPS: 100 | 6 days supply | Qty: 40 | Fill #0

## 2020-04-16 MED FILL — AZITHROMYCIN 250 MG TABLET: 250 | 5 days supply | Qty: 6 | Fill #0

## 2020-04-17 ENCOUNTER — Other Ambulatory Visit (HOSPITAL_BASED_OUTPATIENT_CLINIC_OR_DEPARTMENT_OTHER): Payer: Self-pay | Admitting: Student

## 2020-04-17 DIAGNOSIS — K219 Gastro-esophageal reflux disease without esophagitis: Secondary | ICD-10-CM | POA: Diagnosis not present

## 2020-04-17 DIAGNOSIS — R053 Chronic cough: Secondary | ICD-10-CM | POA: Diagnosis not present

## 2020-04-17 DIAGNOSIS — R198 Other specified symptoms and signs involving the digestive system and abdomen: Secondary | ICD-10-CM | POA: Diagnosis not present

## 2020-04-17 MED FILL — OMEPRAZOLE 40 MG CPDR: 40 | 30 days supply | Qty: 60 | Fill #0

## 2020-04-18 ENCOUNTER — Other Ambulatory Visit (HOSPITAL_BASED_OUTPATIENT_CLINIC_OR_DEPARTMENT_OTHER): Payer: Self-pay | Admitting: Family Medicine

## 2020-04-18 MED FILL — HYCODAN 5-1.5 MG/5ML SYRP: 5-1.5 | 6 days supply | Qty: 120 | Fill #0

## 2020-04-30 ENCOUNTER — Other Ambulatory Visit (HOSPITAL_BASED_OUTPATIENT_CLINIC_OR_DEPARTMENT_OTHER): Payer: Self-pay

## 2020-04-30 DIAGNOSIS — Z0130 Encounter for examination of blood pressure without abnormal findings: Secondary | ICD-10-CM | POA: Diagnosis not present

## 2020-04-30 DIAGNOSIS — Z0000 Encounter for general adult medical examination without abnormal findings: Secondary | ICD-10-CM | POA: Diagnosis not present

## 2020-04-30 DIAGNOSIS — E559 Vitamin D deficiency, unspecified: Secondary | ICD-10-CM | POA: Diagnosis not present

## 2020-04-30 DIAGNOSIS — K219 Gastro-esophageal reflux disease without esophagitis: Secondary | ICD-10-CM | POA: Diagnosis not present

## 2020-04-30 DIAGNOSIS — R030 Elevated blood-pressure reading, without diagnosis of hypertension: Secondary | ICD-10-CM | POA: Diagnosis not present

## 2020-04-30 DIAGNOSIS — N400 Enlarged prostate without lower urinary tract symptoms: Secondary | ICD-10-CM | POA: Diagnosis not present

## 2020-04-30 DIAGNOSIS — R198 Other specified symptoms and signs involving the digestive system and abdomen: Secondary | ICD-10-CM | POA: Diagnosis not present

## 2020-04-30 DIAGNOSIS — Z6832 Body mass index (BMI) 32.0-32.9, adult: Secondary | ICD-10-CM | POA: Diagnosis not present

## 2020-04-30 DIAGNOSIS — E782 Mixed hyperlipidemia: Secondary | ICD-10-CM | POA: Diagnosis not present

## 2020-04-30 MED FILL — FLUTICASONE PROP 50 MCG SPR: 50 | 60 days supply | Qty: 16 | Fill #0

## 2020-04-30 MED FILL — LEVOCETIRIZINE 5 MG TABLET: 5 | 30 days supply | Qty: 30 | Fill #0

## 2020-04-30 MED FILL — EZETIMIBE 10 MG TABS: 10 | 90 days supply | Qty: 90 | Fill #0

## 2020-05-29 ENCOUNTER — Other Ambulatory Visit (HOSPITAL_BASED_OUTPATIENT_CLINIC_OR_DEPARTMENT_OTHER): Payer: Self-pay | Admitting: Optometry

## 2020-05-29 MED FILL — XIIDRA 5 % SOLN: 5 | 90 days supply | Qty: 180 | Fill #0

## 2020-06-11 DIAGNOSIS — H524 Presbyopia: Secondary | ICD-10-CM | POA: Diagnosis not present

## 2020-06-25 MED FILL — FENOFIBRATE 160 MG TABLET: 160 | 90 days supply | Qty: 90 | Fill #2

## 2020-06-25 MED FILL — ATORVASTATIN CALCIUM 40 MG: 40 | 90 days supply | Qty: 90 | Fill #1

## 2020-07-07 MED FILL — XIIDRA 5 % SOLN: 5 | 90 days supply | Qty: 180 | Fill #0

## 2020-08-06 DIAGNOSIS — K224 Dyskinesia of esophagus: Secondary | ICD-10-CM | POA: Diagnosis not present

## 2020-08-07 ENCOUNTER — Other Ambulatory Visit (HOSPITAL_BASED_OUTPATIENT_CLINIC_OR_DEPARTMENT_OTHER): Payer: Self-pay

## 2020-10-09 ENCOUNTER — Other Ambulatory Visit (HOSPITAL_COMMUNITY): Payer: Self-pay

## 2020-10-10 ENCOUNTER — Other Ambulatory Visit (HOSPITAL_COMMUNITY): Payer: Self-pay

## 2020-10-10 MED ORDER — ATORVASTATIN CALCIUM 40 MG PO TABS
ORAL_TABLET | ORAL | 1 refills | Status: AC
  Filled 2020-10-10: qty 90, 90d supply, fill #0
  Filled 2021-01-02: qty 90, 90d supply, fill #1

## 2020-10-10 MED ORDER — FENOFIBRATE 160 MG PO TABS
160.0000 mg | ORAL_TABLET | Freq: Every day | ORAL | 2 refills | Status: AC
  Filled 2020-10-10: qty 90, 90d supply, fill #0
  Filled 2021-01-21: qty 90, 90d supply, fill #1

## 2020-10-11 ENCOUNTER — Other Ambulatory Visit (HOSPITAL_COMMUNITY): Payer: Self-pay

## 2020-10-14 ENCOUNTER — Other Ambulatory Visit (HOSPITAL_COMMUNITY): Payer: Self-pay

## 2020-10-15 DIAGNOSIS — L820 Inflamed seborrheic keratosis: Secondary | ICD-10-CM | POA: Diagnosis not present

## 2020-10-15 DIAGNOSIS — D225 Melanocytic nevi of trunk: Secondary | ICD-10-CM | POA: Diagnosis not present

## 2020-10-15 DIAGNOSIS — L578 Other skin changes due to chronic exposure to nonionizing radiation: Secondary | ICD-10-CM | POA: Diagnosis not present

## 2020-10-15 DIAGNOSIS — D1801 Hemangioma of skin and subcutaneous tissue: Secondary | ICD-10-CM | POA: Diagnosis not present

## 2020-11-12 ENCOUNTER — Other Ambulatory Visit (HOSPITAL_BASED_OUTPATIENT_CLINIC_OR_DEPARTMENT_OTHER): Payer: Self-pay

## 2020-11-12 DIAGNOSIS — R5383 Other fatigue: Secondary | ICD-10-CM | POA: Diagnosis not present

## 2020-11-12 DIAGNOSIS — E782 Mixed hyperlipidemia: Secondary | ICD-10-CM | POA: Diagnosis not present

## 2020-11-12 DIAGNOSIS — J302 Other seasonal allergic rhinitis: Secondary | ICD-10-CM | POA: Diagnosis not present

## 2020-11-12 DIAGNOSIS — M5126 Other intervertebral disc displacement, lumbar region: Secondary | ICD-10-CM | POA: Diagnosis not present

## 2020-11-12 DIAGNOSIS — R030 Elevated blood-pressure reading, without diagnosis of hypertension: Secondary | ICD-10-CM | POA: Diagnosis not present

## 2020-11-12 DIAGNOSIS — Z6831 Body mass index (BMI) 31.0-31.9, adult: Secondary | ICD-10-CM | POA: Diagnosis not present

## 2020-11-12 DIAGNOSIS — Z0130 Encounter for examination of blood pressure without abnormal findings: Secondary | ICD-10-CM | POA: Diagnosis not present

## 2020-11-12 MED ORDER — FENOFIBRATE 160 MG PO TABS
ORAL_TABLET | ORAL | 2 refills | Status: DC
  Filled 2020-11-12 – 2021-04-30 (×2): qty 90, 90d supply, fill #0
  Filled 2021-08-06: qty 90, 90d supply, fill #1
  Filled 2021-11-12: qty 90, 90d supply, fill #2

## 2020-11-12 MED ORDER — ATORVASTATIN CALCIUM 40 MG PO TABS
ORAL_TABLET | ORAL | 1 refills | Status: AC
  Filled 2020-11-12 – 2021-03-31 (×2): qty 90, 90d supply, fill #0
  Filled 2021-06-23: qty 90, 90d supply, fill #1

## 2020-11-12 MED ORDER — EZETIMIBE 10 MG PO TABS
ORAL_TABLET | ORAL | 2 refills | Status: AC
  Filled 2020-11-12: qty 90, 90d supply, fill #0

## 2020-11-12 MED ORDER — HYDROCODONE BIT-HOMATROP MBR 5-1.5 MG/5ML PO SOLN
ORAL | 0 refills | Status: AC
  Filled 2020-11-12: qty 120, 7d supply, fill #0

## 2020-11-12 MED ORDER — FLUTICASONE PROPIONATE 50 MCG/ACT NA SUSP
NASAL | 2 refills | Status: AC
  Filled 2020-11-12: qty 16, 60d supply, fill #0
  Filled 2021-02-06: qty 16, 60d supply, fill #1
  Filled 2021-03-20 – 2021-03-26 (×2): qty 16, 60d supply, fill #2

## 2020-11-12 MED ORDER — LEVOCETIRIZINE DIHYDROCHLORIDE 5 MG PO TABS
ORAL_TABLET | ORAL | 2 refills | Status: AC
  Filled 2020-11-12: qty 30, 30d supply, fill #0

## 2020-11-12 MED ORDER — AZITHROMYCIN 250 MG PO TABS
ORAL_TABLET | ORAL | 0 refills | Status: AC
  Filled 2020-11-12: qty 6, 5d supply, fill #0

## 2020-12-11 MED FILL — Lifitegrast Ophth Soln 5%: OPHTHALMIC | 90 days supply | Qty: 180 | Fill #0 | Status: AC

## 2020-12-12 ENCOUNTER — Other Ambulatory Visit (HOSPITAL_BASED_OUTPATIENT_CLINIC_OR_DEPARTMENT_OTHER): Payer: Self-pay

## 2020-12-15 ENCOUNTER — Other Ambulatory Visit (HOSPITAL_BASED_OUTPATIENT_CLINIC_OR_DEPARTMENT_OTHER): Payer: Self-pay

## 2020-12-16 ENCOUNTER — Other Ambulatory Visit (HOSPITAL_BASED_OUTPATIENT_CLINIC_OR_DEPARTMENT_OTHER): Payer: Self-pay

## 2021-01-02 ENCOUNTER — Other Ambulatory Visit (HOSPITAL_COMMUNITY): Payer: Self-pay

## 2021-01-21 ENCOUNTER — Other Ambulatory Visit (HOSPITAL_COMMUNITY): Payer: Self-pay

## 2021-02-06 ENCOUNTER — Other Ambulatory Visit (HOSPITAL_BASED_OUTPATIENT_CLINIC_OR_DEPARTMENT_OTHER): Payer: Self-pay

## 2021-02-06 ENCOUNTER — Ambulatory Visit: Payer: 59 | Attending: Internal Medicine

## 2021-02-06 DIAGNOSIS — Z23 Encounter for immunization: Secondary | ICD-10-CM

## 2021-02-06 MED ORDER — INFLUENZA VAC SPLIT QUAD 0.5 ML IM SUSY
PREFILLED_SYRINGE | INTRAMUSCULAR | 0 refills | Status: AC
  Filled 2021-02-06: qty 0.5, 1d supply, fill #0

## 2021-02-06 NOTE — Progress Notes (Signed)
   Covid-19 Vaccination Clinic  Name:  Caleb Beck    MRN: [id number] DOB: Nov 22, 1965  02/06/2021  Caleb Beck was observed post Covid-19 immunization for 15 minutes without incident. He was provided with Vaccine Information Sheet and instruction to access the V-Safe system.   Caleb Beck was instructed to call 911 with any severe reactions post vaccine: Difficulty breathing  Swelling of face and throat  A fast heartbeat  A bad rash all over body  Dizziness and weakness

## 2021-02-13 ENCOUNTER — Other Ambulatory Visit (HOSPITAL_BASED_OUTPATIENT_CLINIC_OR_DEPARTMENT_OTHER): Payer: Self-pay

## 2021-02-13 MED ORDER — COVID-19MRNA BIVAL VACC PFIZER 30 MCG/0.3ML IM SUSP
INTRAMUSCULAR | 0 refills | Status: AC
  Filled 2021-02-13: qty 0.3, 1d supply, fill #0

## 2021-03-01 MED FILL — Lifitegrast Ophth Soln 5%: OPHTHALMIC | 90 days supply | Qty: 180 | Fill #1 | Status: AC

## 2021-03-02 ENCOUNTER — Other Ambulatory Visit (HOSPITAL_BASED_OUTPATIENT_CLINIC_OR_DEPARTMENT_OTHER): Payer: Self-pay

## 2021-03-03 ENCOUNTER — Other Ambulatory Visit (HOSPITAL_BASED_OUTPATIENT_CLINIC_OR_DEPARTMENT_OTHER): Payer: Self-pay

## 2021-03-20 ENCOUNTER — Other Ambulatory Visit (HOSPITAL_BASED_OUTPATIENT_CLINIC_OR_DEPARTMENT_OTHER): Payer: Self-pay

## 2021-03-26 ENCOUNTER — Other Ambulatory Visit (HOSPITAL_BASED_OUTPATIENT_CLINIC_OR_DEPARTMENT_OTHER): Payer: Self-pay

## 2021-03-31 ENCOUNTER — Other Ambulatory Visit (HOSPITAL_COMMUNITY): Payer: Self-pay

## 2021-04-21 DIAGNOSIS — Z20822 Contact with and (suspected) exposure to covid-19: Secondary | ICD-10-CM | POA: Diagnosis not present

## 2021-04-21 DIAGNOSIS — J3089 Other allergic rhinitis: Secondary | ICD-10-CM | POA: Diagnosis not present

## 2021-04-30 ENCOUNTER — Other Ambulatory Visit (HOSPITAL_BASED_OUTPATIENT_CLINIC_OR_DEPARTMENT_OTHER): Payer: Self-pay

## 2021-05-13 ENCOUNTER — Other Ambulatory Visit (HOSPITAL_BASED_OUTPATIENT_CLINIC_OR_DEPARTMENT_OTHER): Payer: Self-pay

## 2021-05-13 DIAGNOSIS — Z0001 Encounter for general adult medical examination with abnormal findings: Secondary | ICD-10-CM | POA: Diagnosis not present

## 2021-05-13 DIAGNOSIS — Z6831 Body mass index (BMI) 31.0-31.9, adult: Secondary | ICD-10-CM | POA: Diagnosis not present

## 2021-05-13 DIAGNOSIS — R5383 Other fatigue: Secondary | ICD-10-CM | POA: Diagnosis not present

## 2021-05-13 DIAGNOSIS — E559 Vitamin D deficiency, unspecified: Secondary | ICD-10-CM | POA: Diagnosis not present

## 2021-05-13 DIAGNOSIS — Z0130 Encounter for examination of blood pressure without abnormal findings: Secondary | ICD-10-CM | POA: Diagnosis not present

## 2021-05-13 DIAGNOSIS — E782 Mixed hyperlipidemia: Secondary | ICD-10-CM | POA: Diagnosis not present

## 2021-05-13 DIAGNOSIS — N400 Enlarged prostate without lower urinary tract symptoms: Secondary | ICD-10-CM | POA: Diagnosis not present

## 2021-05-13 MED ORDER — ATORVASTATIN CALCIUM 40 MG PO TABS
40.0000 mg | ORAL_TABLET | Freq: Every day | ORAL | 1 refills | Status: AC
  Filled 2021-05-13 – 2021-10-14 (×2): qty 90, 90d supply, fill #0
  Filled 2022-01-20: qty 90, 90d supply, fill #1

## 2021-05-13 MED ORDER — FLUTICASONE-SALMETEROL 250-50 MCG/ACT IN AEPB
1.0000 | INHALATION_SPRAY | Freq: Two times a day (BID) | RESPIRATORY_TRACT | 6 refills | Status: AC
  Filled 2021-05-13 – 2021-05-15 (×2): qty 60, 30d supply, fill #0

## 2021-05-13 MED ORDER — LEVOCETIRIZINE DIHYDROCHLORIDE 5 MG PO TABS
ORAL_TABLET | ORAL | 6 refills | Status: AC
  Filled 2021-05-13: qty 30, 30d supply, fill #0

## 2021-05-13 MED ORDER — FENOFIBRATE 160 MG PO TABS
160.0000 mg | ORAL_TABLET | Freq: Every day | ORAL | 2 refills | Status: AC
  Filled 2021-05-13: qty 90, 90d supply, fill #0

## 2021-05-13 MED ORDER — EZETIMIBE 10 MG PO TABS
10.0000 mg | ORAL_TABLET | Freq: Every day | ORAL | 2 refills | Status: AC
  Filled 2021-05-13: qty 90, 90d supply, fill #0

## 2021-05-13 MED ORDER — ALBUTEROL SULFATE HFA 108 (90 BASE) MCG/ACT IN AERS
2.0000 | INHALATION_SPRAY | Freq: Four times a day (QID) | RESPIRATORY_TRACT | 6 refills | Status: AC | PRN
  Filled 2021-05-13 – 2021-05-15 (×2): qty 18, 30d supply, fill #0

## 2021-05-13 MED ORDER — AZELASTINE-FLUTICASONE 137-50 MCG/ACT NA SUSP
2.0000 | Freq: Every day | NASAL | 6 refills | Status: AC
  Filled 2021-05-13 – 2021-05-15 (×2): qty 23, 30d supply, fill #0

## 2021-05-13 MED FILL — Lifitegrast Ophth Soln 5%: OPHTHALMIC | 90 days supply | Qty: 180 | Fill #2 | Status: AC

## 2021-05-14 ENCOUNTER — Other Ambulatory Visit (HOSPITAL_BASED_OUTPATIENT_CLINIC_OR_DEPARTMENT_OTHER): Payer: Self-pay

## 2021-05-15 ENCOUNTER — Other Ambulatory Visit (HOSPITAL_BASED_OUTPATIENT_CLINIC_OR_DEPARTMENT_OTHER): Payer: Self-pay

## 2021-05-27 DIAGNOSIS — L821 Other seborrheic keratosis: Secondary | ICD-10-CM | POA: Diagnosis not present

## 2021-05-28 DIAGNOSIS — H35413 Lattice degeneration of retina, bilateral: Secondary | ICD-10-CM | POA: Diagnosis not present

## 2021-05-28 DIAGNOSIS — H43391 Other vitreous opacities, right eye: Secondary | ICD-10-CM | POA: Diagnosis not present

## 2021-05-28 DIAGNOSIS — H31093 Other chorioretinal scars, bilateral: Secondary | ICD-10-CM | POA: Diagnosis not present

## 2021-06-10 DIAGNOSIS — H524 Presbyopia: Secondary | ICD-10-CM | POA: Diagnosis not present

## 2021-06-23 ENCOUNTER — Other Ambulatory Visit (HOSPITAL_COMMUNITY): Payer: Self-pay

## 2021-08-07 ENCOUNTER — Other Ambulatory Visit (HOSPITAL_BASED_OUTPATIENT_CLINIC_OR_DEPARTMENT_OTHER): Payer: Self-pay

## 2021-08-19 ENCOUNTER — Other Ambulatory Visit (HOSPITAL_BASED_OUTPATIENT_CLINIC_OR_DEPARTMENT_OTHER): Payer: Self-pay

## 2021-08-19 DIAGNOSIS — M5126 Other intervertebral disc displacement, lumbar region: Secondary | ICD-10-CM | POA: Diagnosis not present

## 2021-08-19 DIAGNOSIS — Z6829 Body mass index (BMI) 29.0-29.9, adult: Secondary | ICD-10-CM | POA: Diagnosis not present

## 2021-08-19 DIAGNOSIS — J302 Other seasonal allergic rhinitis: Secondary | ICD-10-CM | POA: Diagnosis not present

## 2021-08-19 DIAGNOSIS — E782 Mixed hyperlipidemia: Secondary | ICD-10-CM | POA: Diagnosis not present

## 2021-08-19 DIAGNOSIS — Z0130 Encounter for examination of blood pressure without abnormal findings: Secondary | ICD-10-CM | POA: Diagnosis not present

## 2021-08-19 DIAGNOSIS — R7301 Impaired fasting glucose: Secondary | ICD-10-CM | POA: Diagnosis not present

## 2021-08-19 DIAGNOSIS — F5221 Male erectile disorder: Secondary | ICD-10-CM | POA: Diagnosis not present

## 2021-08-19 DIAGNOSIS — R5383 Other fatigue: Secondary | ICD-10-CM | POA: Diagnosis not present

## 2021-08-19 MED ORDER — ALBUTEROL SULFATE HFA 108 (90 BASE) MCG/ACT IN AERS
2.0000 | INHALATION_SPRAY | Freq: Four times a day (QID) | RESPIRATORY_TRACT | 6 refills | Status: AC | PRN
  Filled 2021-08-19: qty 18, 30d supply, fill #0

## 2021-08-19 MED ORDER — AZELASTINE-FLUTICASONE 137-50 MCG/ACT NA SUSP
2.0000 | Freq: Every day | NASAL | 6 refills | Status: AC
  Filled 2021-08-19: qty 23, 30d supply, fill #0

## 2021-08-19 MED ORDER — EZETIMIBE 10 MG PO TABS
10.0000 mg | ORAL_TABLET | Freq: Every day | ORAL | 2 refills | Status: AC
  Filled 2021-08-19: qty 90, 90d supply, fill #0

## 2021-08-19 MED ORDER — ATORVASTATIN CALCIUM 40 MG PO TABS
40.0000 mg | ORAL_TABLET | Freq: Every day | ORAL | 1 refills | Status: AC
  Filled 2021-08-19: qty 90, 90d supply, fill #0

## 2021-08-19 MED ORDER — FENOFIBRATE 160 MG PO TABS
160.0000 mg | ORAL_TABLET | Freq: Every day | ORAL | 2 refills | Status: AC
  Filled 2021-08-19: qty 90, 90d supply, fill #0

## 2021-08-19 MED ORDER — FLUTICASONE-SALMETEROL 250-50 MCG/ACT IN AEPB
1.0000 | INHALATION_SPRAY | Freq: Two times a day (BID) | RESPIRATORY_TRACT | 6 refills | Status: AC
  Filled 2021-08-19: qty 60, 30d supply, fill #0

## 2021-08-19 MED ORDER — LEVOCETIRIZINE DIHYDROCHLORIDE 5 MG PO TABS
ORAL_TABLET | ORAL | 6 refills | Status: AC
  Filled 2021-08-19: qty 30, 30d supply, fill #0

## 2021-09-03 ENCOUNTER — Other Ambulatory Visit (HOSPITAL_BASED_OUTPATIENT_CLINIC_OR_DEPARTMENT_OTHER): Payer: Self-pay

## 2021-09-03 DIAGNOSIS — E1165 Type 2 diabetes mellitus with hyperglycemia: Secondary | ICD-10-CM | POA: Diagnosis not present

## 2021-09-03 DIAGNOSIS — Z0130 Encounter for examination of blood pressure without abnormal findings: Secondary | ICD-10-CM | POA: Diagnosis not present

## 2021-09-03 DIAGNOSIS — Z6829 Body mass index (BMI) 29.0-29.9, adult: Secondary | ICD-10-CM | POA: Diagnosis not present

## 2021-09-03 MED ORDER — FREESTYLE LITE TEST VI STRP
ORAL_STRIP | 0 refills | Status: AC
  Filled 2021-09-03: qty 100, 50d supply, fill #0

## 2021-09-03 MED ORDER — METFORMIN HCL 500 MG PO TABS
500.0000 mg | ORAL_TABLET | Freq: Every day | ORAL | 1 refills | Status: DC
  Filled 2021-09-03: qty 30, 30d supply, fill #0

## 2021-09-03 MED ORDER — FREESTYLE LITE W/DEVICE KIT
PACK | 0 refills | Status: AC
  Filled 2021-09-03: qty 1, 1d supply, fill #0

## 2021-09-03 MED ORDER — FREESTYLE LANCETS MISC
0 refills | Status: AC
  Filled 2021-09-03: qty 100, 50d supply, fill #0

## 2021-09-17 ENCOUNTER — Other Ambulatory Visit (HOSPITAL_BASED_OUTPATIENT_CLINIC_OR_DEPARTMENT_OTHER): Payer: Self-pay

## 2021-09-17 MED ORDER — METFORMIN HCL 500 MG PO TABS
500.0000 mg | ORAL_TABLET | Freq: Two times a day (BID) | ORAL | 1 refills | Status: DC
  Filled 2021-09-17: qty 180, 90d supply, fill #0
  Filled 2021-12-23: qty 180, 90d supply, fill #1

## 2021-10-14 ENCOUNTER — Other Ambulatory Visit (HOSPITAL_BASED_OUTPATIENT_CLINIC_OR_DEPARTMENT_OTHER): Payer: Self-pay

## 2021-10-14 MED ORDER — FREESTYLE LANCETS MISC
5 refills | Status: AC
  Filled 2021-10-14: qty 100, 50d supply, fill #0

## 2021-10-14 MED ORDER — FREESTYLE LITE TEST VI STRP
ORAL_STRIP | 4 refills | Status: AC
  Filled 2021-10-14: qty 50, 50d supply, fill #0

## 2021-10-15 ENCOUNTER — Other Ambulatory Visit (HOSPITAL_BASED_OUTPATIENT_CLINIC_OR_DEPARTMENT_OTHER): Payer: Self-pay

## 2021-10-15 MED ORDER — CEPHALEXIN 500 MG PO CAPS
ORAL_CAPSULE | ORAL | 0 refills | Status: AC
  Filled 2021-10-15: qty 30, 10d supply, fill #0

## 2021-10-16 ENCOUNTER — Other Ambulatory Visit (HOSPITAL_BASED_OUTPATIENT_CLINIC_OR_DEPARTMENT_OTHER): Payer: Self-pay

## 2021-11-12 ENCOUNTER — Other Ambulatory Visit (HOSPITAL_BASED_OUTPATIENT_CLINIC_OR_DEPARTMENT_OTHER): Payer: Self-pay

## 2021-11-13 ENCOUNTER — Other Ambulatory Visit (HOSPITAL_BASED_OUTPATIENT_CLINIC_OR_DEPARTMENT_OTHER): Payer: Self-pay

## 2021-11-13 MED ORDER — FREESTYLE LANCETS MISC
5 refills | Status: AC
  Filled 2021-11-13: qty 100, 50d supply, fill #0

## 2021-11-16 ENCOUNTER — Other Ambulatory Visit (HOSPITAL_BASED_OUTPATIENT_CLINIC_OR_DEPARTMENT_OTHER): Payer: Self-pay

## 2021-11-25 ENCOUNTER — Other Ambulatory Visit (HOSPITAL_BASED_OUTPATIENT_CLINIC_OR_DEPARTMENT_OTHER): Payer: Self-pay

## 2021-11-25 DIAGNOSIS — R030 Elevated blood-pressure reading, without diagnosis of hypertension: Secondary | ICD-10-CM | POA: Diagnosis not present

## 2021-11-25 DIAGNOSIS — M542 Cervicalgia: Secondary | ICD-10-CM | POA: Diagnosis not present

## 2021-11-25 DIAGNOSIS — E782 Mixed hyperlipidemia: Secondary | ICD-10-CM | POA: Diagnosis not present

## 2021-11-25 DIAGNOSIS — E1165 Type 2 diabetes mellitus with hyperglycemia: Secondary | ICD-10-CM | POA: Diagnosis not present

## 2021-11-25 DIAGNOSIS — Z6822 Body mass index (BMI) 22.0-22.9, adult: Secondary | ICD-10-CM | POA: Diagnosis not present

## 2021-11-25 MED ORDER — FREESTYLE LANCETS MISC
1 refills | Status: AC
  Filled 2021-11-25: qty 100, 50d supply, fill #0

## 2021-11-25 MED ORDER — METFORMIN HCL 500 MG PO TABS
500.0000 mg | ORAL_TABLET | Freq: Two times a day (BID) | ORAL | 1 refills | Status: AC
Start: 2021-11-25 — End: ?
  Filled 2021-11-25: qty 180, 90d supply, fill #0

## 2021-11-25 MED ORDER — FENOFIBRATE 160 MG PO TABS
160.0000 mg | ORAL_TABLET | Freq: Every day | ORAL | 2 refills | Status: DC
Start: 2021-11-25 — End: 2022-11-05
  Filled 2021-11-25: qty 90, 90d supply, fill #0

## 2021-11-25 MED ORDER — FREESTYLE TEST VI STRP
1.0000 | ORAL_STRIP | Freq: Two times a day (BID) | 1 refills | Status: AC
  Filled 2021-11-25: qty 150, 75d supply, fill #0

## 2021-11-25 MED ORDER — FREESTYLE FREEDOM LITE W/DEVICE KIT
PACK | 1 refills | Status: AC
  Filled 2021-11-25: qty 1, 1d supply, fill #0

## 2021-11-30 ENCOUNTER — Other Ambulatory Visit (HOSPITAL_BASED_OUTPATIENT_CLINIC_OR_DEPARTMENT_OTHER): Payer: Self-pay

## 2021-12-24 ENCOUNTER — Other Ambulatory Visit (HOSPITAL_BASED_OUTPATIENT_CLINIC_OR_DEPARTMENT_OTHER): Payer: Self-pay

## 2022-01-06 ENCOUNTER — Other Ambulatory Visit (HOSPITAL_BASED_OUTPATIENT_CLINIC_OR_DEPARTMENT_OTHER): Payer: Self-pay

## 2022-01-06 MED ORDER — HYDROCODONE-ACETAMINOPHEN 7.5-325 MG PO TABS
ORAL_TABLET | ORAL | 0 refills | Status: AC
  Filled 2022-01-06: qty 12, 2d supply, fill #0

## 2022-01-07 DIAGNOSIS — K224 Dyskinesia of esophagus: Secondary | ICD-10-CM | POA: Diagnosis not present

## 2022-01-20 ENCOUNTER — Other Ambulatory Visit (HOSPITAL_BASED_OUTPATIENT_CLINIC_OR_DEPARTMENT_OTHER): Payer: Self-pay

## 2022-01-25 ENCOUNTER — Other Ambulatory Visit (HOSPITAL_BASED_OUTPATIENT_CLINIC_OR_DEPARTMENT_OTHER): Payer: Self-pay

## 2022-01-25 MED ORDER — FENOFIBRATE 160 MG PO TABS
160.0000 mg | ORAL_TABLET | Freq: Every day | ORAL | 3 refills | Status: DC
  Filled 2022-01-25: qty 90, 90d supply, fill #0

## 2022-01-26 ENCOUNTER — Other Ambulatory Visit (HOSPITAL_BASED_OUTPATIENT_CLINIC_OR_DEPARTMENT_OTHER): Payer: Self-pay

## 2022-01-26 MED ORDER — FENOFIBRATE 160 MG PO TABS
160.0000 mg | ORAL_TABLET | Freq: Every day | ORAL | 2 refills | Status: AC
  Filled 2022-01-26 – 2023-01-26 (×2): qty 90, 90d supply, fill #0

## 2022-01-29 ENCOUNTER — Other Ambulatory Visit (HOSPITAL_BASED_OUTPATIENT_CLINIC_OR_DEPARTMENT_OTHER): Payer: Self-pay

## 2022-02-04 ENCOUNTER — Other Ambulatory Visit (HOSPITAL_BASED_OUTPATIENT_CLINIC_OR_DEPARTMENT_OTHER): Payer: Self-pay

## 2022-02-04 MED ORDER — FLUARIX QUADRIVALENT 0.5 ML IM SUSY
PREFILLED_SYRINGE | INTRAMUSCULAR | 0 refills | Status: AC
  Filled 2022-02-04: qty 0.5, 1d supply, fill #0

## 2022-02-05 ENCOUNTER — Other Ambulatory Visit (HOSPITAL_BASED_OUTPATIENT_CLINIC_OR_DEPARTMENT_OTHER): Payer: Self-pay

## 2022-03-20 DIAGNOSIS — U071 COVID-19: Secondary | ICD-10-CM | POA: Diagnosis not present

## 2022-03-20 DIAGNOSIS — R051 Acute cough: Secondary | ICD-10-CM | POA: Diagnosis not present

## 2022-03-26 ENCOUNTER — Other Ambulatory Visit (HOSPITAL_BASED_OUTPATIENT_CLINIC_OR_DEPARTMENT_OTHER): Payer: Self-pay

## 2022-03-26 MED ORDER — METFORMIN HCL 500 MG PO TABS
500.0000 mg | ORAL_TABLET | Freq: Two times a day (BID) | ORAL | 0 refills | Status: DC
  Filled 2022-03-26: qty 60, 30d supply, fill #0

## 2022-03-31 ENCOUNTER — Other Ambulatory Visit (HOSPITAL_BASED_OUTPATIENT_CLINIC_OR_DEPARTMENT_OTHER): Payer: Self-pay

## 2022-03-31 DIAGNOSIS — M19012 Primary osteoarthritis, left shoulder: Secondary | ICD-10-CM | POA: Diagnosis not present

## 2022-03-31 DIAGNOSIS — M25512 Pain in left shoulder: Secondary | ICD-10-CM | POA: Diagnosis not present

## 2022-03-31 MED ORDER — ACETAMINOPHEN ER 650 MG PO TBCR
650.0000 mg | EXTENDED_RELEASE_TABLET | Freq: Three times a day (TID) | ORAL | 0 refills | Status: AC | PRN
  Filled 2022-03-31: qty 100, 34d supply, fill #0

## 2022-03-31 MED ORDER — DICLOFENAC SODIUM 1 % EX GEL
2.0000 g | Freq: Four times a day (QID) | CUTANEOUS | 0 refills | Status: AC | PRN
  Filled 2022-03-31: qty 100, 12d supply, fill #0

## 2022-04-01 ENCOUNTER — Other Ambulatory Visit (HOSPITAL_BASED_OUTPATIENT_CLINIC_OR_DEPARTMENT_OTHER): Payer: Self-pay

## 2022-04-01 DIAGNOSIS — M25512 Pain in left shoulder: Secondary | ICD-10-CM | POA: Diagnosis not present

## 2022-04-01 DIAGNOSIS — M47812 Spondylosis without myelopathy or radiculopathy, cervical region: Secondary | ICD-10-CM | POA: Diagnosis not present

## 2022-04-01 MED ORDER — METHYLPREDNISOLONE 4 MG PO TBPK
ORAL_TABLET | ORAL | 0 refills | Status: AC
  Filled 2022-04-01: qty 21, 6d supply, fill #0

## 2022-04-16 ENCOUNTER — Other Ambulatory Visit (HOSPITAL_BASED_OUTPATIENT_CLINIC_OR_DEPARTMENT_OTHER): Payer: Self-pay

## 2022-04-16 DIAGNOSIS — M542 Cervicalgia: Secondary | ICD-10-CM | POA: Diagnosis not present

## 2022-04-16 DIAGNOSIS — Z79899 Other long term (current) drug therapy: Secondary | ICD-10-CM | POA: Diagnosis not present

## 2022-04-16 DIAGNOSIS — Z79631 Long term (current) use of antimetabolite agent: Secondary | ICD-10-CM | POA: Diagnosis not present

## 2022-04-16 DIAGNOSIS — M5412 Radiculopathy, cervical region: Secondary | ICD-10-CM | POA: Diagnosis not present

## 2022-04-16 MED ORDER — GABAPENTIN 300 MG PO CAPS
300.0000 mg | ORAL_CAPSULE | Freq: Three times a day (TID) | ORAL | 2 refills | Status: AC
  Filled 2022-04-16: qty 90, 30d supply, fill #0
  Filled 2022-05-11: qty 90, 30d supply, fill #1
  Filled 2023-03-17: qty 90, 30d supply, fill #2

## 2022-04-16 MED ORDER — MELOXICAM 15 MG PO TABS
15.0000 mg | ORAL_TABLET | Freq: Every day | ORAL | 2 refills | Status: AC
  Filled 2022-04-16: qty 30, 30d supply, fill #0
  Filled 2022-05-11: qty 30, 30d supply, fill #1

## 2022-04-16 MED ORDER — METHOCARBAMOL 500 MG PO TABS
500.0000 mg | ORAL_TABLET | Freq: Three times a day (TID) | ORAL | 2 refills | Status: DC
  Filled 2022-04-16: qty 90, 30d supply, fill #0
  Filled 2022-05-11: qty 90, 30d supply, fill #1

## 2022-04-28 ENCOUNTER — Other Ambulatory Visit (HOSPITAL_BASED_OUTPATIENT_CLINIC_OR_DEPARTMENT_OTHER): Payer: Self-pay

## 2022-04-28 DIAGNOSIS — E1165 Type 2 diabetes mellitus with hyperglycemia: Secondary | ICD-10-CM | POA: Diagnosis not present

## 2022-04-28 DIAGNOSIS — J301 Allergic rhinitis due to pollen: Secondary | ICD-10-CM | POA: Diagnosis not present

## 2022-04-28 DIAGNOSIS — R030 Elevated blood-pressure reading, without diagnosis of hypertension: Secondary | ICD-10-CM | POA: Diagnosis not present

## 2022-04-28 DIAGNOSIS — M545 Low back pain, unspecified: Secondary | ICD-10-CM | POA: Diagnosis not present

## 2022-04-28 DIAGNOSIS — G8929 Other chronic pain: Secondary | ICD-10-CM | POA: Diagnosis not present

## 2022-04-28 DIAGNOSIS — M542 Cervicalgia: Secondary | ICD-10-CM | POA: Diagnosis not present

## 2022-04-28 DIAGNOSIS — R29898 Other symptoms and signs involving the musculoskeletal system: Secondary | ICD-10-CM | POA: Diagnosis not present

## 2022-04-28 DIAGNOSIS — F5221 Male erectile disorder: Secondary | ICD-10-CM | POA: Diagnosis not present

## 2022-04-28 DIAGNOSIS — M5412 Radiculopathy, cervical region: Secondary | ICD-10-CM | POA: Diagnosis not present

## 2022-04-28 DIAGNOSIS — E781 Pure hyperglyceridemia: Secondary | ICD-10-CM | POA: Diagnosis not present

## 2022-04-28 DIAGNOSIS — E782 Mixed hyperlipidemia: Secondary | ICD-10-CM | POA: Diagnosis not present

## 2022-04-28 MED ORDER — FENOFIBRATE 160 MG PO TABS
160.0000 mg | ORAL_TABLET | Freq: Every day | ORAL | 1 refills | Status: DC
  Filled 2022-04-28: qty 90, 90d supply, fill #0

## 2022-04-28 MED ORDER — ATORVASTATIN CALCIUM 40 MG PO TABS
40.0000 mg | ORAL_TABLET | Freq: Every day | ORAL | 1 refills | Status: DC
  Filled 2022-04-28: qty 90, 90d supply, fill #0
  Filled 2022-11-08 – 2023-01-10 (×6): qty 90, 90d supply, fill #1

## 2022-04-28 MED ORDER — OMEGA-3-ACID ETHYL ESTERS 1 G PO CAPS
2.0000 g | ORAL_CAPSULE | Freq: Every day | ORAL | 1 refills | Status: AC
  Filled 2022-04-28: qty 180, 90d supply, fill #0
  Filled 2023-03-17: qty 180, 90d supply, fill #1

## 2022-04-28 MED ORDER — METFORMIN HCL 500 MG PO TABS
500.0000 mg | ORAL_TABLET | Freq: Two times a day (BID) | ORAL | 1 refills | Status: DC
  Filled 2022-04-28: qty 180, 90d supply, fill #0

## 2022-04-28 MED ORDER — LOSARTAN POTASSIUM 25 MG PO TABS
25.0000 mg | ORAL_TABLET | Freq: Every day | ORAL | 1 refills | Status: AC
  Filled 2022-04-28: qty 90, 90d supply, fill #0
  Filled 2023-01-26: qty 90, 90d supply, fill #1

## 2022-04-29 ENCOUNTER — Other Ambulatory Visit: Payer: Self-pay | Admitting: Orthopedic Surgery

## 2022-04-29 DIAGNOSIS — M542 Cervicalgia: Secondary | ICD-10-CM

## 2022-04-29 DIAGNOSIS — M5412 Radiculopathy, cervical region: Secondary | ICD-10-CM

## 2022-05-01 ENCOUNTER — Ambulatory Visit (INDEPENDENT_AMBULATORY_CARE_PROVIDER_SITE_OTHER): Payer: 59

## 2022-05-01 DIAGNOSIS — M4802 Spinal stenosis, cervical region: Secondary | ICD-10-CM | POA: Diagnosis not present

## 2022-05-01 DIAGNOSIS — R29898 Other symptoms and signs involving the musculoskeletal system: Secondary | ICD-10-CM | POA: Diagnosis not present

## 2022-05-01 DIAGNOSIS — M5412 Radiculopathy, cervical region: Secondary | ICD-10-CM | POA: Diagnosis not present

## 2022-05-01 DIAGNOSIS — M542 Cervicalgia: Secondary | ICD-10-CM

## 2022-05-01 DIAGNOSIS — M47812 Spondylosis without myelopathy or radiculopathy, cervical region: Secondary | ICD-10-CM | POA: Diagnosis not present

## 2022-05-12 ENCOUNTER — Other Ambulatory Visit: Payer: Self-pay

## 2022-05-12 ENCOUNTER — Other Ambulatory Visit (HOSPITAL_BASED_OUTPATIENT_CLINIC_OR_DEPARTMENT_OTHER): Payer: Self-pay

## 2022-05-12 DIAGNOSIS — R29898 Other symptoms and signs involving the musculoskeletal system: Secondary | ICD-10-CM | POA: Diagnosis not present

## 2022-05-12 DIAGNOSIS — M542 Cervicalgia: Secondary | ICD-10-CM | POA: Diagnosis not present

## 2022-05-12 DIAGNOSIS — M5412 Radiculopathy, cervical region: Secondary | ICD-10-CM | POA: Diagnosis not present

## 2022-05-14 DIAGNOSIS — M542 Cervicalgia: Secondary | ICD-10-CM | POA: Diagnosis not present

## 2022-05-14 DIAGNOSIS — R29898 Other symptoms and signs involving the musculoskeletal system: Secondary | ICD-10-CM | POA: Diagnosis not present

## 2022-05-14 DIAGNOSIS — M5412 Radiculopathy, cervical region: Secondary | ICD-10-CM | POA: Diagnosis not present

## 2022-05-26 DIAGNOSIS — M5412 Radiculopathy, cervical region: Secondary | ICD-10-CM | POA: Diagnosis not present

## 2022-05-26 DIAGNOSIS — M542 Cervicalgia: Secondary | ICD-10-CM | POA: Diagnosis not present

## 2022-05-26 DIAGNOSIS — R29898 Other symptoms and signs involving the musculoskeletal system: Secondary | ICD-10-CM | POA: Diagnosis not present

## 2022-05-28 DIAGNOSIS — M542 Cervicalgia: Secondary | ICD-10-CM | POA: Diagnosis not present

## 2022-05-28 DIAGNOSIS — R29898 Other symptoms and signs involving the musculoskeletal system: Secondary | ICD-10-CM | POA: Diagnosis not present

## 2022-05-28 DIAGNOSIS — M5412 Radiculopathy, cervical region: Secondary | ICD-10-CM | POA: Diagnosis not present

## 2022-06-09 DIAGNOSIS — M5412 Radiculopathy, cervical region: Secondary | ICD-10-CM | POA: Diagnosis not present

## 2022-06-09 DIAGNOSIS — M542 Cervicalgia: Secondary | ICD-10-CM | POA: Diagnosis not present

## 2022-06-09 DIAGNOSIS — R29898 Other symptoms and signs involving the musculoskeletal system: Secondary | ICD-10-CM | POA: Diagnosis not present

## 2022-06-11 DIAGNOSIS — M5412 Radiculopathy, cervical region: Secondary | ICD-10-CM | POA: Diagnosis not present

## 2022-06-11 DIAGNOSIS — M542 Cervicalgia: Secondary | ICD-10-CM | POA: Diagnosis not present

## 2022-06-11 DIAGNOSIS — R29898 Other symptoms and signs involving the musculoskeletal system: Secondary | ICD-10-CM | POA: Diagnosis not present

## 2022-06-23 DIAGNOSIS — M5412 Radiculopathy, cervical region: Secondary | ICD-10-CM | POA: Diagnosis not present

## 2022-06-23 DIAGNOSIS — M542 Cervicalgia: Secondary | ICD-10-CM | POA: Diagnosis not present

## 2022-06-23 DIAGNOSIS — R29898 Other symptoms and signs involving the musculoskeletal system: Secondary | ICD-10-CM | POA: Diagnosis not present

## 2022-06-25 ENCOUNTER — Other Ambulatory Visit (HOSPITAL_BASED_OUTPATIENT_CLINIC_OR_DEPARTMENT_OTHER): Payer: Self-pay

## 2022-06-25 DIAGNOSIS — M5412 Radiculopathy, cervical region: Secondary | ICD-10-CM | POA: Diagnosis not present

## 2022-06-25 MED ORDER — GABAPENTIN 300 MG PO CAPS
300.0000 mg | ORAL_CAPSULE | Freq: Three times a day (TID) | ORAL | 2 refills | Status: DC
  Filled 2022-06-25: qty 90, 30d supply, fill #0
  Filled 2022-08-20: qty 90, 30d supply, fill #1
  Filled 2022-10-14: qty 90, 30d supply, fill #2

## 2022-06-25 MED ORDER — MELOXICAM 15 MG PO TABS
15.0000 mg | ORAL_TABLET | Freq: Every day | ORAL | 2 refills | Status: AC | PRN
  Filled 2022-06-25: qty 30, 30d supply, fill #0

## 2022-07-22 DIAGNOSIS — M5412 Radiculopathy, cervical region: Secondary | ICD-10-CM | POA: Diagnosis not present

## 2022-07-22 DIAGNOSIS — R29898 Other symptoms and signs involving the musculoskeletal system: Secondary | ICD-10-CM | POA: Diagnosis not present

## 2022-07-22 DIAGNOSIS — M542 Cervicalgia: Secondary | ICD-10-CM | POA: Diagnosis not present

## 2022-07-26 ENCOUNTER — Other Ambulatory Visit (HOSPITAL_BASED_OUTPATIENT_CLINIC_OR_DEPARTMENT_OTHER): Payer: Self-pay

## 2022-08-05 DIAGNOSIS — R29898 Other symptoms and signs involving the musculoskeletal system: Secondary | ICD-10-CM | POA: Diagnosis not present

## 2022-08-05 DIAGNOSIS — M542 Cervicalgia: Secondary | ICD-10-CM | POA: Diagnosis not present

## 2022-08-05 DIAGNOSIS — M5412 Radiculopathy, cervical region: Secondary | ICD-10-CM | POA: Diagnosis not present

## 2022-08-06 ENCOUNTER — Other Ambulatory Visit (HOSPITAL_BASED_OUTPATIENT_CLINIC_OR_DEPARTMENT_OTHER): Payer: Self-pay

## 2022-08-06 MED ORDER — FENOFIBRATE 160 MG PO TABS
160.0000 mg | ORAL_TABLET | Freq: Every day | ORAL | 1 refills | Status: DC
  Filled 2022-08-06: qty 90, 90d supply, fill #0
  Filled 2022-10-14 – 2022-10-19 (×2): qty 90, 90d supply, fill #1

## 2022-08-06 MED ORDER — ATORVASTATIN CALCIUM 40 MG PO TABS
40.0000 mg | ORAL_TABLET | Freq: Every evening | ORAL | 1 refills | Status: DC
  Filled 2022-08-06: qty 90, 90d supply, fill #0
  Filled 2022-10-14 – 2022-10-19 (×2): qty 90, 90d supply, fill #1

## 2022-08-06 MED ORDER — FREESTYLE LITE TEST VI STRP
ORAL_STRIP | 5 refills | Status: AC
  Filled 2022-08-06: qty 100, 50d supply, fill #0
  Filled 2023-02-15: qty 100, 50d supply, fill #1
  Filled 2023-04-26: qty 100, 50d supply, fill #2

## 2022-08-06 MED ORDER — METFORMIN HCL 500 MG PO TABS
500.0000 mg | ORAL_TABLET | Freq: Two times a day (BID) | ORAL | 1 refills | Status: DC
  Filled 2022-08-06: qty 180, 90d supply, fill #0
  Filled 2022-11-05: qty 180, 90d supply, fill #1

## 2022-08-06 MED ORDER — LOSARTAN POTASSIUM 25 MG PO TABS
25.0000 mg | ORAL_TABLET | Freq: Every day | ORAL | 1 refills | Status: DC
  Filled 2022-08-06: qty 90, 90d supply, fill #0
  Filled 2022-10-14 – 2022-10-19 (×2): qty 90, 90d supply, fill #1

## 2022-08-19 DIAGNOSIS — R29898 Other symptoms and signs involving the musculoskeletal system: Secondary | ICD-10-CM | POA: Diagnosis not present

## 2022-08-19 DIAGNOSIS — M5412 Radiculopathy, cervical region: Secondary | ICD-10-CM | POA: Diagnosis not present

## 2022-08-19 DIAGNOSIS — M542 Cervicalgia: Secondary | ICD-10-CM | POA: Diagnosis not present

## 2022-08-20 ENCOUNTER — Other Ambulatory Visit (HOSPITAL_BASED_OUTPATIENT_CLINIC_OR_DEPARTMENT_OTHER): Payer: Self-pay

## 2022-09-16 DIAGNOSIS — R29898 Other symptoms and signs involving the musculoskeletal system: Secondary | ICD-10-CM | POA: Diagnosis not present

## 2022-09-16 DIAGNOSIS — M542 Cervicalgia: Secondary | ICD-10-CM | POA: Diagnosis not present

## 2022-09-16 DIAGNOSIS — M5412 Radiculopathy, cervical region: Secondary | ICD-10-CM | POA: Diagnosis not present

## 2022-10-15 ENCOUNTER — Other Ambulatory Visit (HOSPITAL_BASED_OUTPATIENT_CLINIC_OR_DEPARTMENT_OTHER): Payer: Self-pay

## 2022-11-05 ENCOUNTER — Other Ambulatory Visit (HOSPITAL_BASED_OUTPATIENT_CLINIC_OR_DEPARTMENT_OTHER): Payer: Self-pay

## 2022-11-09 ENCOUNTER — Other Ambulatory Visit (HOSPITAL_BASED_OUTPATIENT_CLINIC_OR_DEPARTMENT_OTHER): Payer: Self-pay

## 2022-11-10 ENCOUNTER — Other Ambulatory Visit (HOSPITAL_BASED_OUTPATIENT_CLINIC_OR_DEPARTMENT_OTHER): Payer: Self-pay

## 2022-11-10 DIAGNOSIS — H1045 Other chronic allergic conjunctivitis: Secondary | ICD-10-CM | POA: Diagnosis not present

## 2022-11-10 DIAGNOSIS — H04123 Dry eye syndrome of bilateral lacrimal glands: Secondary | ICD-10-CM | POA: Diagnosis not present

## 2022-11-10 DIAGNOSIS — H524 Presbyopia: Secondary | ICD-10-CM | POA: Diagnosis not present

## 2022-11-10 DIAGNOSIS — H59813 Chorioretinal scars after surgery for detachment, bilateral: Secondary | ICD-10-CM | POA: Diagnosis not present

## 2022-11-10 DIAGNOSIS — H35411 Lattice degeneration of retina, right eye: Secondary | ICD-10-CM | POA: Diagnosis not present

## 2022-11-10 MED ORDER — XIIDRA 5 % OP SOLN
1.0000 [drp] | Freq: Two times a day (BID) | OPHTHALMIC | 4 refills | Status: AC
  Filled 2022-11-10: qty 180, 90d supply, fill #0
  Filled 2023-02-01: qty 180, 90d supply, fill #1
  Filled 2023-02-02: qty 120, 60d supply, fill #1

## 2022-11-11 ENCOUNTER — Other Ambulatory Visit (HOSPITAL_BASED_OUTPATIENT_CLINIC_OR_DEPARTMENT_OTHER): Payer: Self-pay

## 2022-11-11 DIAGNOSIS — M542 Cervicalgia: Secondary | ICD-10-CM | POA: Diagnosis not present

## 2022-11-11 DIAGNOSIS — M5412 Radiculopathy, cervical region: Secondary | ICD-10-CM | POA: Diagnosis not present

## 2022-11-12 ENCOUNTER — Other Ambulatory Visit (HOSPITAL_BASED_OUTPATIENT_CLINIC_OR_DEPARTMENT_OTHER): Payer: Self-pay

## 2022-11-15 ENCOUNTER — Other Ambulatory Visit (HOSPITAL_BASED_OUTPATIENT_CLINIC_OR_DEPARTMENT_OTHER): Payer: Self-pay

## 2022-11-26 ENCOUNTER — Other Ambulatory Visit (HOSPITAL_BASED_OUTPATIENT_CLINIC_OR_DEPARTMENT_OTHER): Payer: Self-pay

## 2022-12-22 ENCOUNTER — Other Ambulatory Visit (HOSPITAL_BASED_OUTPATIENT_CLINIC_OR_DEPARTMENT_OTHER): Payer: Self-pay

## 2022-12-22 ENCOUNTER — Other Ambulatory Visit: Payer: Self-pay

## 2022-12-22 DIAGNOSIS — E1165 Type 2 diabetes mellitus with hyperglycemia: Secondary | ICD-10-CM | POA: Diagnosis not present

## 2022-12-22 DIAGNOSIS — N400 Enlarged prostate without lower urinary tract symptoms: Secondary | ICD-10-CM | POA: Diagnosis not present

## 2022-12-22 DIAGNOSIS — Z86010 Personal history of colonic polyps: Secondary | ICD-10-CM | POA: Diagnosis not present

## 2022-12-22 DIAGNOSIS — E781 Pure hyperglyceridemia: Secondary | ICD-10-CM | POA: Diagnosis not present

## 2022-12-22 DIAGNOSIS — R5383 Other fatigue: Secondary | ICD-10-CM | POA: Diagnosis not present

## 2022-12-22 DIAGNOSIS — Z0000 Encounter for general adult medical examination without abnormal findings: Secondary | ICD-10-CM | POA: Diagnosis not present

## 2022-12-22 DIAGNOSIS — I10 Essential (primary) hypertension: Secondary | ICD-10-CM | POA: Diagnosis not present

## 2022-12-22 DIAGNOSIS — E782 Mixed hyperlipidemia: Secondary | ICD-10-CM | POA: Diagnosis not present

## 2022-12-22 DIAGNOSIS — G63 Polyneuropathy in diseases classified elsewhere: Secondary | ICD-10-CM | POA: Diagnosis not present

## 2022-12-22 DIAGNOSIS — M5412 Radiculopathy, cervical region: Secondary | ICD-10-CM | POA: Diagnosis not present

## 2022-12-22 MED ORDER — METFORMIN HCL 500 MG PO TABS
500.0000 mg | ORAL_TABLET | Freq: Two times a day (BID) | ORAL | 1 refills | Status: DC
  Filled 2022-12-23 – 2023-01-17 (×18): qty 180, 90d supply, fill #0
  Filled 2023-04-12: qty 180, 90d supply, fill #1

## 2022-12-22 MED ORDER — ATORVASTATIN CALCIUM 40 MG PO TABS
40.0000 mg | ORAL_TABLET | Freq: Every evening | ORAL | 1 refills | Status: DC
  Filled 2023-01-26 – 2023-04-11 (×8): qty 90, 90d supply, fill #0
  Filled 2023-07-04 – 2023-07-06 (×2): qty 90, 90d supply, fill #1

## 2022-12-22 MED ORDER — LOSARTAN POTASSIUM 25 MG PO TABS
25.0000 mg | ORAL_TABLET | Freq: Every day | ORAL | 1 refills | Status: AC
  Filled 2023-04-26: qty 90, 90d supply, fill #0
  Filled 2023-07-25: qty 90, 90d supply, fill #1

## 2022-12-22 MED ORDER — GABAPENTIN 300 MG PO CAPS
300.0000 mg | ORAL_CAPSULE | Freq: Three times a day (TID) | ORAL | 1 refills | Status: DC
  Filled 2022-12-22: qty 270, 90d supply, fill #0
  Filled 2023-04-18: qty 270, 90d supply, fill #1

## 2022-12-22 MED ORDER — OMEGA-3-ACID ETHYL ESTERS 1 G PO CAPS
2.0000 g | ORAL_CAPSULE | Freq: Every day | ORAL | 1 refills | Status: DC
  Filled 2022-12-22: qty 180, 90d supply, fill #0
  Filled 2023-06-15: qty 180, 90d supply, fill #1

## 2022-12-22 MED ORDER — FENOFIBRATE 160 MG PO TABS
160.0000 mg | ORAL_TABLET | Freq: Every day | ORAL | 1 refills | Status: AC
  Filled 2023-01-27 – 2023-01-31 (×3): qty 90, 90d supply, fill #0

## 2022-12-23 ENCOUNTER — Other Ambulatory Visit (HOSPITAL_BASED_OUTPATIENT_CLINIC_OR_DEPARTMENT_OTHER): Payer: Self-pay

## 2022-12-23 DIAGNOSIS — M542 Cervicalgia: Secondary | ICD-10-CM | POA: Diagnosis not present

## 2022-12-23 DIAGNOSIS — R29898 Other symptoms and signs involving the musculoskeletal system: Secondary | ICD-10-CM | POA: Diagnosis not present

## 2022-12-23 DIAGNOSIS — M5412 Radiculopathy, cervical region: Secondary | ICD-10-CM | POA: Diagnosis not present

## 2022-12-24 ENCOUNTER — Other Ambulatory Visit (HOSPITAL_BASED_OUTPATIENT_CLINIC_OR_DEPARTMENT_OTHER): Payer: Self-pay

## 2022-12-27 ENCOUNTER — Other Ambulatory Visit (HOSPITAL_BASED_OUTPATIENT_CLINIC_OR_DEPARTMENT_OTHER): Payer: Self-pay

## 2022-12-28 ENCOUNTER — Other Ambulatory Visit: Payer: Self-pay

## 2022-12-29 ENCOUNTER — Other Ambulatory Visit (HOSPITAL_BASED_OUTPATIENT_CLINIC_OR_DEPARTMENT_OTHER): Payer: Self-pay

## 2022-12-30 ENCOUNTER — Other Ambulatory Visit (HOSPITAL_BASED_OUTPATIENT_CLINIC_OR_DEPARTMENT_OTHER): Payer: Self-pay

## 2022-12-31 ENCOUNTER — Other Ambulatory Visit (HOSPITAL_BASED_OUTPATIENT_CLINIC_OR_DEPARTMENT_OTHER): Payer: Self-pay

## 2023-01-03 ENCOUNTER — Other Ambulatory Visit (HOSPITAL_BASED_OUTPATIENT_CLINIC_OR_DEPARTMENT_OTHER): Payer: Self-pay

## 2023-01-04 ENCOUNTER — Other Ambulatory Visit: Payer: Self-pay

## 2023-01-05 ENCOUNTER — Other Ambulatory Visit (HOSPITAL_BASED_OUTPATIENT_CLINIC_OR_DEPARTMENT_OTHER): Payer: Self-pay

## 2023-01-06 ENCOUNTER — Other Ambulatory Visit (HOSPITAL_BASED_OUTPATIENT_CLINIC_OR_DEPARTMENT_OTHER): Payer: Self-pay

## 2023-01-07 ENCOUNTER — Other Ambulatory Visit (HOSPITAL_BASED_OUTPATIENT_CLINIC_OR_DEPARTMENT_OTHER): Payer: Self-pay

## 2023-01-10 ENCOUNTER — Other Ambulatory Visit (HOSPITAL_BASED_OUTPATIENT_CLINIC_OR_DEPARTMENT_OTHER): Payer: Self-pay

## 2023-01-11 ENCOUNTER — Other Ambulatory Visit: Payer: Self-pay

## 2023-01-12 ENCOUNTER — Other Ambulatory Visit (HOSPITAL_BASED_OUTPATIENT_CLINIC_OR_DEPARTMENT_OTHER): Payer: Self-pay

## 2023-01-13 ENCOUNTER — Other Ambulatory Visit (HOSPITAL_BASED_OUTPATIENT_CLINIC_OR_DEPARTMENT_OTHER): Payer: Self-pay

## 2023-01-14 ENCOUNTER — Other Ambulatory Visit (HOSPITAL_BASED_OUTPATIENT_CLINIC_OR_DEPARTMENT_OTHER): Payer: Self-pay

## 2023-01-17 ENCOUNTER — Other Ambulatory Visit (HOSPITAL_BASED_OUTPATIENT_CLINIC_OR_DEPARTMENT_OTHER): Payer: Self-pay

## 2023-01-26 ENCOUNTER — Other Ambulatory Visit (HOSPITAL_BASED_OUTPATIENT_CLINIC_OR_DEPARTMENT_OTHER): Payer: Self-pay

## 2023-01-26 ENCOUNTER — Other Ambulatory Visit: Payer: Self-pay

## 2023-01-27 ENCOUNTER — Other Ambulatory Visit (HOSPITAL_BASED_OUTPATIENT_CLINIC_OR_DEPARTMENT_OTHER): Payer: Self-pay

## 2023-01-28 ENCOUNTER — Other Ambulatory Visit (HOSPITAL_BASED_OUTPATIENT_CLINIC_OR_DEPARTMENT_OTHER): Payer: Self-pay

## 2023-01-31 ENCOUNTER — Other Ambulatory Visit: Payer: Self-pay

## 2023-02-01 ENCOUNTER — Other Ambulatory Visit: Payer: Self-pay

## 2023-02-02 ENCOUNTER — Other Ambulatory Visit (HOSPITAL_BASED_OUTPATIENT_CLINIC_OR_DEPARTMENT_OTHER): Payer: Self-pay

## 2023-02-02 ENCOUNTER — Other Ambulatory Visit: Payer: Self-pay

## 2023-02-02 DIAGNOSIS — M542 Cervicalgia: Secondary | ICD-10-CM | POA: Diagnosis not present

## 2023-02-02 DIAGNOSIS — M5412 Radiculopathy, cervical region: Secondary | ICD-10-CM | POA: Diagnosis not present

## 2023-02-02 DIAGNOSIS — R29898 Other symptoms and signs involving the musculoskeletal system: Secondary | ICD-10-CM | POA: Diagnosis not present

## 2023-02-03 ENCOUNTER — Other Ambulatory Visit (HOSPITAL_BASED_OUTPATIENT_CLINIC_OR_DEPARTMENT_OTHER): Payer: Self-pay

## 2023-02-04 ENCOUNTER — Other Ambulatory Visit (HOSPITAL_BASED_OUTPATIENT_CLINIC_OR_DEPARTMENT_OTHER): Payer: Self-pay

## 2023-02-15 ENCOUNTER — Other Ambulatory Visit (HOSPITAL_BASED_OUTPATIENT_CLINIC_OR_DEPARTMENT_OTHER): Payer: Self-pay

## 2023-02-15 MED ORDER — COMIRNATY 30 MCG/0.3ML IM SUSY
0.3000 mL | PREFILLED_SYRINGE | Freq: Once | INTRAMUSCULAR | 0 refills | Status: AC
  Filled 2023-02-15: qty 0.3, 1d supply, fill #0

## 2023-02-15 MED ORDER — FLULAVAL 0.5 ML IM SUSY
0.5000 mL | PREFILLED_SYRINGE | Freq: Once | INTRAMUSCULAR | 0 refills | Status: AC
  Filled 2023-02-15: qty 0.5, 1d supply, fill #0

## 2023-02-18 DIAGNOSIS — M542 Cervicalgia: Secondary | ICD-10-CM | POA: Diagnosis not present

## 2023-02-18 DIAGNOSIS — M5412 Radiculopathy, cervical region: Secondary | ICD-10-CM | POA: Diagnosis not present

## 2023-02-18 DIAGNOSIS — R29898 Other symptoms and signs involving the musculoskeletal system: Secondary | ICD-10-CM | POA: Diagnosis not present

## 2023-03-03 DIAGNOSIS — M542 Cervicalgia: Secondary | ICD-10-CM | POA: Diagnosis not present

## 2023-03-03 DIAGNOSIS — M5412 Radiculopathy, cervical region: Secondary | ICD-10-CM | POA: Diagnosis not present

## 2023-04-01 DIAGNOSIS — E782 Mixed hyperlipidemia: Secondary | ICD-10-CM | POA: Diagnosis not present

## 2023-04-01 DIAGNOSIS — E1165 Type 2 diabetes mellitus with hyperglycemia: Secondary | ICD-10-CM | POA: Diagnosis not present

## 2023-04-01 DIAGNOSIS — I10 Essential (primary) hypertension: Secondary | ICD-10-CM | POA: Diagnosis not present

## 2023-04-01 DIAGNOSIS — G63 Polyneuropathy in diseases classified elsewhere: Secondary | ICD-10-CM | POA: Diagnosis not present

## 2023-04-01 DIAGNOSIS — R1031 Right lower quadrant pain: Secondary | ICD-10-CM | POA: Diagnosis not present

## 2023-04-11 ENCOUNTER — Other Ambulatory Visit: Payer: Self-pay

## 2023-04-11 ENCOUNTER — Other Ambulatory Visit (HOSPITAL_BASED_OUTPATIENT_CLINIC_OR_DEPARTMENT_OTHER): Payer: Self-pay

## 2023-04-12 ENCOUNTER — Other Ambulatory Visit: Payer: Self-pay

## 2023-04-12 ENCOUNTER — Other Ambulatory Visit (HOSPITAL_BASED_OUTPATIENT_CLINIC_OR_DEPARTMENT_OTHER): Payer: Self-pay

## 2023-04-12 MED ORDER — ATORVASTATIN CALCIUM 40 MG PO TABS
40.0000 mg | ORAL_TABLET | Freq: Every day | ORAL | 1 refills | Status: AC
  Filled 2023-04-12 – 2023-12-27 (×7): qty 90, 90d supply, fill #0
  Filled 2024-03-26: qty 90, 90d supply, fill #1

## 2023-04-13 ENCOUNTER — Other Ambulatory Visit (HOSPITAL_BASED_OUTPATIENT_CLINIC_OR_DEPARTMENT_OTHER): Payer: Self-pay

## 2023-04-15 ENCOUNTER — Other Ambulatory Visit (HOSPITAL_BASED_OUTPATIENT_CLINIC_OR_DEPARTMENT_OTHER): Payer: Self-pay

## 2023-04-18 ENCOUNTER — Other Ambulatory Visit: Payer: Self-pay

## 2023-04-19 ENCOUNTER — Other Ambulatory Visit (HOSPITAL_BASED_OUTPATIENT_CLINIC_OR_DEPARTMENT_OTHER): Payer: Self-pay

## 2023-04-20 ENCOUNTER — Other Ambulatory Visit (HOSPITAL_BASED_OUTPATIENT_CLINIC_OR_DEPARTMENT_OTHER): Payer: Self-pay

## 2023-04-26 ENCOUNTER — Other Ambulatory Visit (HOSPITAL_BASED_OUTPATIENT_CLINIC_OR_DEPARTMENT_OTHER): Payer: Self-pay

## 2023-05-12 DIAGNOSIS — E782 Mixed hyperlipidemia: Secondary | ICD-10-CM | POA: Diagnosis not present

## 2023-05-12 DIAGNOSIS — R9431 Abnormal electrocardiogram [ECG] [EKG]: Secondary | ICD-10-CM | POA: Diagnosis not present

## 2023-05-12 DIAGNOSIS — I10 Essential (primary) hypertension: Secondary | ICD-10-CM | POA: Diagnosis not present

## 2023-05-26 ENCOUNTER — Other Ambulatory Visit (HOSPITAL_BASED_OUTPATIENT_CLINIC_OR_DEPARTMENT_OTHER): Payer: Self-pay

## 2023-05-26 MED ORDER — FENOFIBRATE 160 MG PO TABS
160.0000 mg | ORAL_TABLET | Freq: Every day | ORAL | 1 refills | Status: AC
  Filled 2023-05-26: qty 90, 90d supply, fill #0

## 2023-06-08 DIAGNOSIS — R9431 Abnormal electrocardiogram [ECG] [EKG]: Secondary | ICD-10-CM | POA: Diagnosis not present

## 2023-06-08 DIAGNOSIS — I10 Essential (primary) hypertension: Secondary | ICD-10-CM | POA: Diagnosis not present

## 2023-06-08 DIAGNOSIS — E782 Mixed hyperlipidemia: Secondary | ICD-10-CM | POA: Diagnosis not present

## 2023-07-04 ENCOUNTER — Other Ambulatory Visit (HOSPITAL_BASED_OUTPATIENT_CLINIC_OR_DEPARTMENT_OTHER): Payer: Self-pay

## 2023-07-06 ENCOUNTER — Other Ambulatory Visit (HOSPITAL_BASED_OUTPATIENT_CLINIC_OR_DEPARTMENT_OTHER): Payer: Self-pay

## 2023-07-06 DIAGNOSIS — E782 Mixed hyperlipidemia: Secondary | ICD-10-CM | POA: Diagnosis not present

## 2023-07-06 DIAGNOSIS — E781 Pure hyperglyceridemia: Secondary | ICD-10-CM | POA: Diagnosis not present

## 2023-07-06 DIAGNOSIS — M5412 Radiculopathy, cervical region: Secondary | ICD-10-CM | POA: Diagnosis not present

## 2023-07-06 DIAGNOSIS — I10 Essential (primary) hypertension: Secondary | ICD-10-CM | POA: Diagnosis not present

## 2023-07-06 DIAGNOSIS — G63 Polyneuropathy in diseases classified elsewhere: Secondary | ICD-10-CM | POA: Diagnosis not present

## 2023-07-06 DIAGNOSIS — R7301 Impaired fasting glucose: Secondary | ICD-10-CM | POA: Diagnosis not present

## 2023-07-06 DIAGNOSIS — E1165 Type 2 diabetes mellitus with hyperglycemia: Secondary | ICD-10-CM | POA: Diagnosis not present

## 2023-07-06 DIAGNOSIS — Z1331 Encounter for screening for depression: Secondary | ICD-10-CM | POA: Diagnosis not present

## 2023-07-06 MED ORDER — GABAPENTIN 300 MG PO CAPS
300.0000 mg | ORAL_CAPSULE | Freq: Three times a day (TID) | ORAL | 1 refills | Status: DC
  Filled 2023-07-18: qty 270, 90d supply, fill #0
  Filled 2023-10-17: qty 270, 90d supply, fill #1

## 2023-07-06 MED ORDER — OMEGA-3-ACID ETHYL ESTERS 1 G PO CAPS
2.0000 g | ORAL_CAPSULE | Freq: Every day | ORAL | 1 refills | Status: DC
  Filled 2023-07-06 – 2023-09-13 (×2): qty 180, 90d supply, fill #0
  Filled 2023-12-12: qty 180, 90d supply, fill #1

## 2023-07-06 MED ORDER — METFORMIN HCL 500 MG PO TABS
500.0000 mg | ORAL_TABLET | Freq: Two times a day (BID) | ORAL | 1 refills | Status: DC
Start: 2023-07-06 — End: 2024-01-02
  Filled 2023-07-06: qty 180, 90d supply, fill #0
  Filled 2023-10-10: qty 180, 90d supply, fill #1

## 2023-07-06 MED ORDER — ATORVASTATIN CALCIUM 40 MG PO TABS
40.0000 mg | ORAL_TABLET | Freq: Every day | ORAL | 1 refills | Status: AC
  Filled 2023-07-06: qty 90, 90d supply, fill #0

## 2023-07-06 MED ORDER — FENOFIBRATE 160 MG PO TABS
160.0000 mg | ORAL_TABLET | Freq: Every day | ORAL | 1 refills | Status: AC
  Filled 2023-07-07 – 2023-08-08 (×8): qty 90, 90d supply, fill #0
  Filled 2023-11-07: qty 90, 90d supply, fill #1

## 2023-07-06 MED ORDER — LOSARTAN POTASSIUM 25 MG PO TABS
25.0000 mg | ORAL_TABLET | Freq: Every day | ORAL | 1 refills | Status: AC
  Filled 2023-07-06 – 2023-10-24 (×2): qty 90, 90d supply, fill #0
  Filled 2024-04-20: qty 90, 90d supply, fill #1

## 2023-07-07 ENCOUNTER — Other Ambulatory Visit (HOSPITAL_BASED_OUTPATIENT_CLINIC_OR_DEPARTMENT_OTHER): Payer: Self-pay

## 2023-07-08 ENCOUNTER — Other Ambulatory Visit (HOSPITAL_BASED_OUTPATIENT_CLINIC_OR_DEPARTMENT_OTHER): Payer: Self-pay

## 2023-07-08 ENCOUNTER — Other Ambulatory Visit: Payer: Self-pay

## 2023-07-11 ENCOUNTER — Other Ambulatory Visit (HOSPITAL_BASED_OUTPATIENT_CLINIC_OR_DEPARTMENT_OTHER): Payer: Self-pay

## 2023-07-12 ENCOUNTER — Other Ambulatory Visit (HOSPITAL_BASED_OUTPATIENT_CLINIC_OR_DEPARTMENT_OTHER): Payer: Self-pay

## 2023-07-13 ENCOUNTER — Other Ambulatory Visit (HOSPITAL_BASED_OUTPATIENT_CLINIC_OR_DEPARTMENT_OTHER): Payer: Self-pay

## 2023-07-14 ENCOUNTER — Other Ambulatory Visit (HOSPITAL_BASED_OUTPATIENT_CLINIC_OR_DEPARTMENT_OTHER): Payer: Self-pay

## 2023-07-15 ENCOUNTER — Other Ambulatory Visit (HOSPITAL_BASED_OUTPATIENT_CLINIC_OR_DEPARTMENT_OTHER): Payer: Self-pay

## 2023-07-18 ENCOUNTER — Other Ambulatory Visit (HOSPITAL_BASED_OUTPATIENT_CLINIC_OR_DEPARTMENT_OTHER): Payer: Self-pay

## 2023-08-08 ENCOUNTER — Other Ambulatory Visit (HOSPITAL_BASED_OUTPATIENT_CLINIC_OR_DEPARTMENT_OTHER): Payer: Self-pay

## 2023-08-18 ENCOUNTER — Other Ambulatory Visit (HOSPITAL_BASED_OUTPATIENT_CLINIC_OR_DEPARTMENT_OTHER): Payer: Self-pay

## 2023-08-18 MED ORDER — FREESTYLE LITE TEST VI STRP
1.0000 | ORAL_STRIP | 5 refills | Status: AC
  Filled 2023-08-18: qty 100, 50d supply, fill #0
  Filled 2023-09-30: qty 100, 50d supply, fill #1
  Filled 2023-11-21: qty 100, 50d supply, fill #2
  Filled 2024-01-09 – 2024-01-13 (×2): qty 100, 50d supply, fill #3
  Filled 2024-02-29: qty 100, 50d supply, fill #4
  Filled 2024-04-19: qty 100, 50d supply, fill #5

## 2023-09-13 ENCOUNTER — Other Ambulatory Visit (HOSPITAL_BASED_OUTPATIENT_CLINIC_OR_DEPARTMENT_OTHER): Payer: Self-pay

## 2023-09-30 ENCOUNTER — Other Ambulatory Visit: Payer: Self-pay

## 2023-10-03 ENCOUNTER — Other Ambulatory Visit (HOSPITAL_BASED_OUTPATIENT_CLINIC_OR_DEPARTMENT_OTHER): Payer: Self-pay

## 2023-10-03 MED ORDER — ATORVASTATIN CALCIUM 40 MG PO TABS
40.0000 mg | ORAL_TABLET | Freq: Every day | ORAL | 1 refills | Status: AC
  Filled 2023-10-03: qty 90, 90d supply, fill #0

## 2023-10-12 ENCOUNTER — Other Ambulatory Visit (HOSPITAL_BASED_OUTPATIENT_CLINIC_OR_DEPARTMENT_OTHER): Payer: Self-pay

## 2023-10-12 MED ORDER — METHOCARBAMOL 500 MG PO TABS
500.0000 mg | ORAL_TABLET | Freq: Three times a day (TID) | ORAL | 0 refills | Status: AC
  Filled 2023-10-12: qty 90, 30d supply, fill #0

## 2023-10-19 ENCOUNTER — Other Ambulatory Visit (HOSPITAL_BASED_OUTPATIENT_CLINIC_OR_DEPARTMENT_OTHER): Payer: Self-pay

## 2023-10-24 ENCOUNTER — Other Ambulatory Visit (HOSPITAL_BASED_OUTPATIENT_CLINIC_OR_DEPARTMENT_OTHER): Payer: Self-pay

## 2023-10-25 ENCOUNTER — Other Ambulatory Visit (HOSPITAL_BASED_OUTPATIENT_CLINIC_OR_DEPARTMENT_OTHER): Payer: Self-pay

## 2023-11-02 ENCOUNTER — Other Ambulatory Visit (HOSPITAL_BASED_OUTPATIENT_CLINIC_OR_DEPARTMENT_OTHER): Payer: Self-pay

## 2023-11-09 ENCOUNTER — Other Ambulatory Visit (HOSPITAL_BASED_OUTPATIENT_CLINIC_OR_DEPARTMENT_OTHER): Payer: Self-pay

## 2023-11-14 ENCOUNTER — Other Ambulatory Visit (HOSPITAL_BASED_OUTPATIENT_CLINIC_OR_DEPARTMENT_OTHER): Payer: Self-pay

## 2023-12-15 ENCOUNTER — Other Ambulatory Visit (HOSPITAL_BASED_OUTPATIENT_CLINIC_OR_DEPARTMENT_OTHER): Payer: Self-pay

## 2023-12-27 ENCOUNTER — Other Ambulatory Visit (HOSPITAL_BASED_OUTPATIENT_CLINIC_OR_DEPARTMENT_OTHER): Payer: Self-pay

## 2023-12-27 ENCOUNTER — Other Ambulatory Visit: Payer: Self-pay

## 2024-01-02 ENCOUNTER — Other Ambulatory Visit (HOSPITAL_BASED_OUTPATIENT_CLINIC_OR_DEPARTMENT_OTHER): Payer: Self-pay

## 2024-01-02 MED ORDER — METFORMIN HCL 500 MG PO TABS
500.0000 mg | ORAL_TABLET | Freq: Two times a day (BID) | ORAL | 1 refills | Status: DC
  Filled 2024-01-02 – 2024-01-13 (×2): qty 180, 90d supply, fill #0
  Filled 2024-04-09: qty 180, 90d supply, fill #1

## 2024-01-04 ENCOUNTER — Other Ambulatory Visit (HOSPITAL_BASED_OUTPATIENT_CLINIC_OR_DEPARTMENT_OTHER): Payer: Self-pay

## 2024-01-12 ENCOUNTER — Other Ambulatory Visit (HOSPITAL_BASED_OUTPATIENT_CLINIC_OR_DEPARTMENT_OTHER): Payer: Self-pay

## 2024-01-13 ENCOUNTER — Other Ambulatory Visit: Payer: Self-pay

## 2024-01-13 ENCOUNTER — Other Ambulatory Visit (HOSPITAL_BASED_OUTPATIENT_CLINIC_OR_DEPARTMENT_OTHER): Payer: Self-pay

## 2024-01-16 ENCOUNTER — Other Ambulatory Visit (HOSPITAL_BASED_OUTPATIENT_CLINIC_OR_DEPARTMENT_OTHER): Payer: Self-pay

## 2024-01-17 ENCOUNTER — Other Ambulatory Visit (HOSPITAL_BASED_OUTPATIENT_CLINIC_OR_DEPARTMENT_OTHER): Payer: Self-pay

## 2024-01-17 MED ORDER — GABAPENTIN 300 MG PO CAPS
300.0000 mg | ORAL_CAPSULE | Freq: Three times a day (TID) | ORAL | 1 refills | Status: AC
  Filled 2024-01-17: qty 270, 90d supply, fill #0
  Filled 2024-04-09: qty 270, 90d supply, fill #1

## 2024-01-18 ENCOUNTER — Other Ambulatory Visit: Payer: Self-pay

## 2024-01-18 ENCOUNTER — Other Ambulatory Visit (HOSPITAL_BASED_OUTPATIENT_CLINIC_OR_DEPARTMENT_OTHER): Payer: Self-pay

## 2024-01-18 MED ORDER — OMEGA-3-ACID ETHYL ESTERS 1 G PO CAPS
2.0000 g | ORAL_CAPSULE | Freq: Every day | ORAL | 1 refills | Status: AC
  Filled 2024-03-06: qty 180, 90d supply, fill #0
  Filled 2024-06-04: qty 180, 90d supply, fill #1
  Filled 2024-06-19: qty 60, 30d supply, fill #1

## 2024-01-18 MED ORDER — LOSARTAN POTASSIUM 25 MG PO TABS
25.0000 mg | ORAL_TABLET | Freq: Every day | ORAL | 1 refills | Status: AC
  Filled 2024-01-18: qty 90, 90d supply, fill #0

## 2024-01-18 MED ORDER — FENOFIBRATE 160 MG PO TABS
160.0000 mg | ORAL_TABLET | Freq: Every day | ORAL | 1 refills | Status: AC
  Filled 2024-01-18 – 2024-02-06 (×2): qty 90, 90d supply, fill #0
  Filled 2024-05-07: qty 90, 90d supply, fill #1

## 2024-02-06 ENCOUNTER — Other Ambulatory Visit (HOSPITAL_BASED_OUTPATIENT_CLINIC_OR_DEPARTMENT_OTHER): Payer: Self-pay

## 2024-03-06 ENCOUNTER — Other Ambulatory Visit (HOSPITAL_BASED_OUTPATIENT_CLINIC_OR_DEPARTMENT_OTHER): Payer: Self-pay

## 2024-04-10 DIAGNOSIS — J069 Acute upper respiratory infection, unspecified: Secondary | ICD-10-CM | POA: Diagnosis not present

## 2024-04-10 DIAGNOSIS — Z20822 Contact with and (suspected) exposure to covid-19: Secondary | ICD-10-CM | POA: Diagnosis not present

## 2024-04-10 DIAGNOSIS — R051 Acute cough: Secondary | ICD-10-CM | POA: Diagnosis not present

## 2024-04-25 ENCOUNTER — Other Ambulatory Visit (HOSPITAL_BASED_OUTPATIENT_CLINIC_OR_DEPARTMENT_OTHER): Payer: Self-pay

## 2024-04-25 MED ORDER — FENOFIBRATE 160 MG PO TABS
160.0000 mg | ORAL_TABLET | Freq: Every day | ORAL | 1 refills | Status: AC
  Filled 2024-04-25: qty 90, 90d supply, fill #0

## 2024-04-25 MED ORDER — METFORMIN HCL 500 MG PO TABS
500.0000 mg | ORAL_TABLET | Freq: Two times a day (BID) | ORAL | 1 refills | Status: AC
  Filled 2024-04-25 – 2024-04-26 (×2): qty 180, 90d supply, fill #0

## 2024-04-25 MED ORDER — LOSARTAN POTASSIUM 25 MG PO TABS
25.0000 mg | ORAL_TABLET | Freq: Every day | ORAL | 1 refills | Status: AC
  Filled 2024-04-25: qty 90, 90d supply, fill #0

## 2024-04-25 MED ORDER — OMEGA-3-ACID ETHYL ESTERS 1 G PO CAPS
2.0000 | ORAL_CAPSULE | Freq: Every day | ORAL | 1 refills | Status: AC
  Filled 2024-04-25 – 2024-04-26 (×2): qty 180, 90d supply, fill #0

## 2024-04-25 MED ORDER — ATORVASTATIN CALCIUM 40 MG PO TABS
40.0000 mg | ORAL_TABLET | Freq: Every evening | ORAL | 1 refills | Status: AC
  Filled 2024-04-25 – 2024-04-26 (×2): qty 90, 90d supply, fill #0

## 2024-04-26 ENCOUNTER — Other Ambulatory Visit (HOSPITAL_BASED_OUTPATIENT_CLINIC_OR_DEPARTMENT_OTHER): Payer: Self-pay

## 2024-05-25 ENCOUNTER — Other Ambulatory Visit (HOSPITAL_BASED_OUTPATIENT_CLINIC_OR_DEPARTMENT_OTHER): Payer: Self-pay

## 2024-06-04 ENCOUNTER — Other Ambulatory Visit (HOSPITAL_BASED_OUTPATIENT_CLINIC_OR_DEPARTMENT_OTHER): Payer: Self-pay

## 2024-06-18 ENCOUNTER — Other Ambulatory Visit (HOSPITAL_BASED_OUTPATIENT_CLINIC_OR_DEPARTMENT_OTHER): Payer: Self-pay

## 2024-06-19 ENCOUNTER — Other Ambulatory Visit (HOSPITAL_BASED_OUTPATIENT_CLINIC_OR_DEPARTMENT_OTHER): Payer: Self-pay
